# Patient Record
Sex: Female | Born: 1940 | Race: White | Hispanic: No | Marital: Single | Smoking: Never smoker
Health system: Southern US, Community
[De-identification: ages and names within clinical notes are randomized; demographics above are authoritative.]

## PROBLEM LIST (undated history)

## (undated) DIAGNOSIS — I4891 Unspecified atrial fibrillation: Secondary | ICD-10-CM

## (undated) DIAGNOSIS — I10 Essential (primary) hypertension: Secondary | ICD-10-CM

## (undated) DIAGNOSIS — G5603 Carpal tunnel syndrome, bilateral upper limbs: Secondary | ICD-10-CM

## (undated) DIAGNOSIS — Z7901 Long term (current) use of anticoagulants: Secondary | ICD-10-CM

## (undated) DIAGNOSIS — I495 Sick sinus syndrome: Secondary | ICD-10-CM

## (undated) DIAGNOSIS — J8283 Eosinophilic asthma: Secondary | ICD-10-CM

## (undated) DIAGNOSIS — I517 Cardiomegaly: Secondary | ICD-10-CM

## (undated) DIAGNOSIS — M1990 Unspecified osteoarthritis, unspecified site: Secondary | ICD-10-CM

## (undated) DIAGNOSIS — Z9841 Cataract extraction status, right eye: Secondary | ICD-10-CM

## (undated) DIAGNOSIS — J449 Chronic obstructive pulmonary disease, unspecified: Secondary | ICD-10-CM

## (undated) DIAGNOSIS — I071 Rheumatic tricuspid insufficiency: Secondary | ICD-10-CM

## (undated) DIAGNOSIS — I700 Atherosclerosis of aorta: Secondary | ICD-10-CM

## (undated) DIAGNOSIS — J45909 Unspecified asthma, uncomplicated: Secondary | ICD-10-CM

## (undated) DIAGNOSIS — K219 Gastro-esophageal reflux disease without esophagitis: Secondary | ICD-10-CM

## (undated) DIAGNOSIS — G5793 Unspecified mononeuropathy of bilateral lower limbs: Secondary | ICD-10-CM

## (undated) DIAGNOSIS — Z87442 Personal history of urinary calculi: Secondary | ICD-10-CM

## (undated) DIAGNOSIS — G629 Polyneuropathy, unspecified: Secondary | ICD-10-CM

## (undated) DIAGNOSIS — Z950 Presence of cardiac pacemaker: Secondary | ICD-10-CM

## (undated) HISTORY — PX: CARDIAC SURGERY: SHX584

## (undated) HISTORY — PX: HIP SURGERY: SHX245

## (undated) HISTORY — PX: TOTAL HIP ARTHROPLASTY: SHX124

## (undated) HISTORY — PX: CATARACT EXTRACTION, BILATERAL: SHX1313

## (undated) HISTORY — PX: BILATERAL CARPAL TUNNEL RELEASE: SHX6508

## (undated) HISTORY — DX: Presence of cardiac pacemaker: Z95.0

## (undated) HISTORY — PX: INSERT / REPLACE / REMOVE PACEMAKER: SUR710

## (undated) HISTORY — PX: TOTAL KNEE ARTHROPLASTY: SHX125

## (undated) HISTORY — PX: KNEE SURGERY: SHX244

## (undated) HISTORY — DX: Polyneuropathy, unspecified: G62.9

## (undated) HISTORY — PX: CARDIAC VALVE SURGERY: SHX40

## (undated) HISTORY — PX: PACEMAKER INSERTION: SHX728

## (undated) HISTORY — PX: CHOLECYSTECTOMY: SHX55

---

## 2002-07-27 HISTORY — PX: CARDIAC ELECTROPHYSIOLOGY STUDY AND ABLATION: SHX1294

## 2004-07-27 HISTORY — PX: CARDIAC ELECTROPHYSIOLOGY STUDY AND ABLATION: SHX1294

## 2007-07-28 DIAGNOSIS — Z950 Presence of cardiac pacemaker: Secondary | ICD-10-CM

## 2007-07-28 HISTORY — DX: Presence of cardiac pacemaker: Z95.0

## 2007-07-28 HISTORY — PX: PACEMAKER INSERTION: SHX728

## 2015-02-07 ENCOUNTER — Emergency Department
Admission: EM | Admit: 2015-02-07 | Discharge: 2015-02-07 | Disposition: A | Discharge status: Home / Self Care | Payer: Medicare Other | Attending: Emergency Medicine | Admitting: Emergency Medicine

## 2015-02-07 ENCOUNTER — Encounter: Payer: Self-pay | Admitting: Emergency Medicine

## 2015-02-07 ENCOUNTER — Emergency Department: Payer: Medicare Other

## 2015-02-07 DIAGNOSIS — R42 Dizziness and giddiness: Secondary | ICD-10-CM | POA: Insufficient documentation

## 2015-02-07 DIAGNOSIS — Z7901 Long term (current) use of anticoagulants: Secondary | ICD-10-CM | POA: Insufficient documentation

## 2015-02-07 DIAGNOSIS — Z79899 Other long term (current) drug therapy: Secondary | ICD-10-CM | POA: Diagnosis not present

## 2015-02-07 DIAGNOSIS — R55 Syncope and collapse: Secondary | ICD-10-CM | POA: Diagnosis not present

## 2015-02-07 HISTORY — DX: Unspecified osteoarthritis, unspecified site: M19.90

## 2015-02-07 HISTORY — DX: Unspecified atrial fibrillation: I48.91

## 2015-02-07 HISTORY — DX: Chronic obstructive pulmonary disease, unspecified: J44.9

## 2015-02-07 LAB — COMPREHENSIVE METABOLIC PANEL
ALBUMIN: 4.4 g/dL (ref 3.5–5.0)
ALT: 23 U/L (ref 14–54)
ANION GAP: 9 (ref 5–15)
AST: 29 U/L (ref 15–41)
Alkaline Phosphatase: 76 U/L (ref 38–126)
BUN: 16 mg/dL (ref 6–20)
CALCIUM: 9.6 mg/dL (ref 8.9–10.3)
CHLORIDE: 103 mmol/L (ref 101–111)
CO2: 28 mmol/L (ref 22–32)
Creatinine, Ser: 0.91 mg/dL (ref 0.44–1.00)
GFR calc Af Amer: >60 mL/min (ref >60)
GFR calc non Af Amer: >60 mL/min (ref >60)
Glucose, Bld: 101 mg/dL — ABNORMAL HIGH (ref 65–99)
Potassium: 3.7 mmol/L (ref 3.5–5.1)
Sodium: 140 mmol/L (ref 135–145)
TOTAL PROTEIN: 8.6 g/dL — AB (ref 6.5–8.1)
Total Bilirubin: 0.7 mg/dL (ref 0.3–1.2)

## 2015-02-07 LAB — URINALYSIS COMPLETE WITH MICROSCOPIC (ARMC ONLY)
Bacteria, UA: NONE SEEN
Bilirubin Urine: NEGATIVE
Glucose, UA: NEGATIVE mg/dL
Ketones, ur: NEGATIVE mg/dL
LEUKOCYTES UA: NEGATIVE
Nitrite: NEGATIVE
PROTEIN: NEGATIVE mg/dL
Specific Gravity, Urine: 1.004 — ABNORMAL LOW (ref 1.005–1.030)
pH: 7 (ref 5.0–8.0)

## 2015-02-07 LAB — CBC
HEMATOCRIT: 42.1 % (ref 35.0–47.0)
HEMOGLOBIN: 13.9 g/dL (ref 12.0–16.0)
MCH: 29.8 pg (ref 26.0–34.0)
MCHC: 33.1 g/dL (ref 32.0–36.0)
MCV: 90.1 fL (ref 80.0–100.0)
Platelets: 307 10*3/uL (ref 150–440)
RBC: 4.67 MIL/uL (ref 3.80–5.20)
RDW: 13.6 % (ref 11.5–14.5)
WBC: 7.2 10*3/uL (ref 3.6–11.0)

## 2015-02-07 LAB — TROPONIN I

## 2015-02-07 LAB — PROTIME-INR
INR: 2.12
PROTHROMBIN TIME: 23.9 s — AB (ref 11.4–15.0)

## 2015-02-07 MED ORDER — SODIUM CHLORIDE 0.9 % IV SOLN
1000.0000 mL | Freq: Once | INTRAVENOUS | Status: AC
  Administered 2015-02-07: 1000 mL via INTRAVENOUS

## 2015-02-07 NOTE — ED Notes (Signed)
Pt reports dizziness since last pm. Skin w/d

## 2015-02-07 NOTE — ED Notes (Signed)
Patient c/o an increased in dizziness and seeing "black dots" during IV insertion. Patient was placed in Trendelenburg position and symptoms have improved. Patient requested to be placed back in an semi-Fowlers position and symptoms have not returned at this time.

## 2015-02-07 NOTE — ED Provider Notes (Signed)
[facility] Emergency Department Provider Note  ____________________________________________  Time seen: On arrival  I have reviewed the triage vital signs and the nursing notes.   HISTORY  Chief Complaint Dizziness    HPI Kelly Olsen is a 74 y.o. female who complains of dizziness and near syncope today. She reports she has had dizziness symptoms intermittently for approximately one month but today's episode was particularly bad. She did not pass out. She has no chest pain. No nausea no vomiting. No shortness of breath. No headache, no neuro deficits. She reports a history of atrial fibrillation on Coumadin     Past Medical History  Diagnosis Date  . Arthritis   . Atrial fibrillation   . COPD (chronic obstructive pulmonary disease)     There are no active problems to display for this patient.   Past Surgical History  Procedure Laterality Date  . Pacemaker insertion    . Hip surgery    . Knee surgery    . Cholecystectomy      Current Outpatient Rx  Name  Route  Sig  Dispense  Refill  . amiodarone (PACERONE) 200 MG tablet   Oral   Take 1 tablet by mouth every morning.      1   . Calcium Carb-Cholecalciferol (CALCIUM 1000 + D) 1000-800 MG-UNIT TABS   Oral   Take 1 tablet by mouth daily.         . carvedilol (COREG) 25 MG tablet   Oral   Take 1 tablet by mouth 2 (two) times daily.      1   . furosemide (LASIX) 40 MG tablet   Oral   Take 1 tablet by mouth daily.      0   . losartan (COZAAR) 50 MG tablet   Oral   Take 1 tablet by mouth daily.      1   . Multiple Vitamin (MULTIVITAMIN) capsule   Oral   Take 1 capsule by mouth daily.         Marland Kitchen omeprazole (PRILOSEC) 20 MG capsule   Oral   Take 1 capsule by mouth daily.      0   . Potassium 99 MG TABS   Oral   Take 1 tablet by mouth daily.         Marland Kitchen warfarin (COUMADIN) 3 MG tablet   Oral   Take 1 tablet by mouth daily. Take as directed      2      Allergies Codeine  History reviewed. No pertinent family history.  Social History History  Substance Use Topics  . Smoking status: Never Smoker   . Smokeless tobacco: Not on file  . Alcohol Use: No    Review of Systems  Constitutional: Negative for fever. Eyes: Negative for visual changes. ENT: Negative for sore throat Cardiovascular: Negative for chest pain. Respiratory: Negative for shortness of breath. Gastrointestinal: Negative for abdominal pain, vomiting and diarrhea. Genitourinary: Negative for dysuria. Musculoskeletal: Negative for back pain. Skin: Negative for rash. Neurological: Positive for dizziness no vertigo Psychiatric: No anxiety  10-point ROS otherwise negative.  ____________________________________________   PHYSICAL EXAM:  VITAL SIGNS: ED Triage Vitals  Enc Vitals Group     BP 02/07/15 1138 150/92 mmHg     Pulse Rate 02/07/15 1138 101     Resp 02/07/15 1138 18     Temp 02/07/15 1138 98.3 F (36.8 C)     Temp Source 02/07/15 1138 Oral     SpO2 02/07/15  1138 97 %     Weight 02/07/15 1133 170 lb (77.111 kg)     Height 02/07/15 1133 5\' 8"  (1.727 m)     Head Cir --      Peak Flow --      Pain Score --      Pain Loc --      Pain Edu? --      Excl. in GC? --      Constitutional: Alert and oriented. Well appearing and in no distress. Eyes: Conjunctivae are normal.  ENT   Head: Normocephalic and atraumatic.   Mouth/Throat: Mucous membranes are moist. Cardiovascular: Normal rate, regular rhythm. Normal and symmetric distal pulses are present in all extremities. No murmurs, rubs, or gallops. Respiratory: Normal respiratory effort without tachypnea nor retractions. Breath sounds are clear and equal bilaterally.  Gastrointestinal: Soft and non-tender in all quadrants. No distention. There is no CVA tenderness. Genitourinary: deferred Musculoskeletal: Nontender with normal range of motion in all extremities. No lower extremity  tenderness nor edema. Neurologic:  Normal speech and language. No gross focal neurologic deficits are appreciated. Skin:  Skin is warm, dry and intact. No rash noted. Psychiatric: Mood and affect are normal. Patient exhibits appropriate insight and judgment.  ____________________________________________    LABS (pertinent positives/negatives)  Labs Reviewed  COMPREHENSIVE METABOLIC PANEL - Abnormal; Notable for the following:    Glucose, Bld 101 (*)    Total Protein 8.6 (*)    All other components within normal limits  URINALYSIS COMPLETEWITH MICROSCOPIC ([facility] ONLY) - Abnormal; Notable for the following:    Color, Urine YELLOW (*)    APPearance CLEAR (*)    Specific Gravity, Urine 1.004 (*)    Hgb urine dipstick 2+ (*)    Squamous Epithelial / LPF 0-5 (*)    All other components within normal limits  CBC  TROPONIN I  PROTIME-INR    ____________________________________________   EKG  ED ECG REPORT I, Jene EveryKINNER, Amro Winebarger, the attending physician, personally viewed and interpreted this ECG.   Date: 02/07/2015  EKG Time:  11:35 AM  Rate: 100  Rhythm: Ventricular paced rhythm  Axis: Normal  Intervals:Abnormal QRS  ST&T Change: Nonspecific   ____________________________________________    RADIOLOGY I have personally reviewed any xrays that were ordered on this patient: No acute distress  ____________________________________________   PROCEDURES  Procedure(s) performed: none  Critical Care performed: none  ____________________________________________   INITIAL IMPRESSION / ASSESSMENT AND PLAN / ED COURSE  Pertinent labs & imaging results that were available during my care of the patient were reviewed by me and considered in my medical decision making (see chart for details).  Patient well-appearing and in no distress upon initial exam. She has a history of atrial fibrillation with a pacemaker and has had dizziness for approximately one month intermittently.  She feels better in the emergency department. We will check labs and EKG chest x-ray and reevaluate. We will give 1 L normal saline IV and check orthostatics  ____________________________________________ ----------------------------------------- 3:25 PM on 02/07/2015 -----------------------------------------  Patient feels well. No more dizziness. Orthostatic normal. Discussed with cardiology Dr. Gwen PoundsKowalski will see her in the office tomorrow at 4 PM.  FINAL CLINICAL IMPRESSION(S) / ED DIAGNOSES  Final diagnoses:  Dizziness     Jene Everyobert Lalah Durango, MD 02/07/15 1525

## 2015-02-07 NOTE — Discharge Instructions (Signed)

## 2015-09-05 DIAGNOSIS — J449 Chronic obstructive pulmonary disease, unspecified: Secondary | ICD-10-CM

## 2015-09-05 DIAGNOSIS — I10 Essential (primary) hypertension: Secondary | ICD-10-CM | POA: Insufficient documentation

## 2015-09-05 DIAGNOSIS — I482 Chronic atrial fibrillation, unspecified: Secondary | ICD-10-CM

## 2016-07-09 DIAGNOSIS — R609 Edema, unspecified: Secondary | ICD-10-CM | POA: Insufficient documentation

## 2016-07-09 DIAGNOSIS — I509 Heart failure, unspecified: Secondary | ICD-10-CM | POA: Insufficient documentation

## 2016-07-27 HISTORY — PX: OTHER SURGICAL HISTORY: SHX169

## 2016-08-17 IMAGING — CR DG CHEST 1V PORT
1 series · 1 of 1 positions shown · non-contrast
Comparison: None.

CLINICAL DATA: Dizziness since last evening.

EXAM:
PORTABLE CHEST - 1 VIEW

[ap]
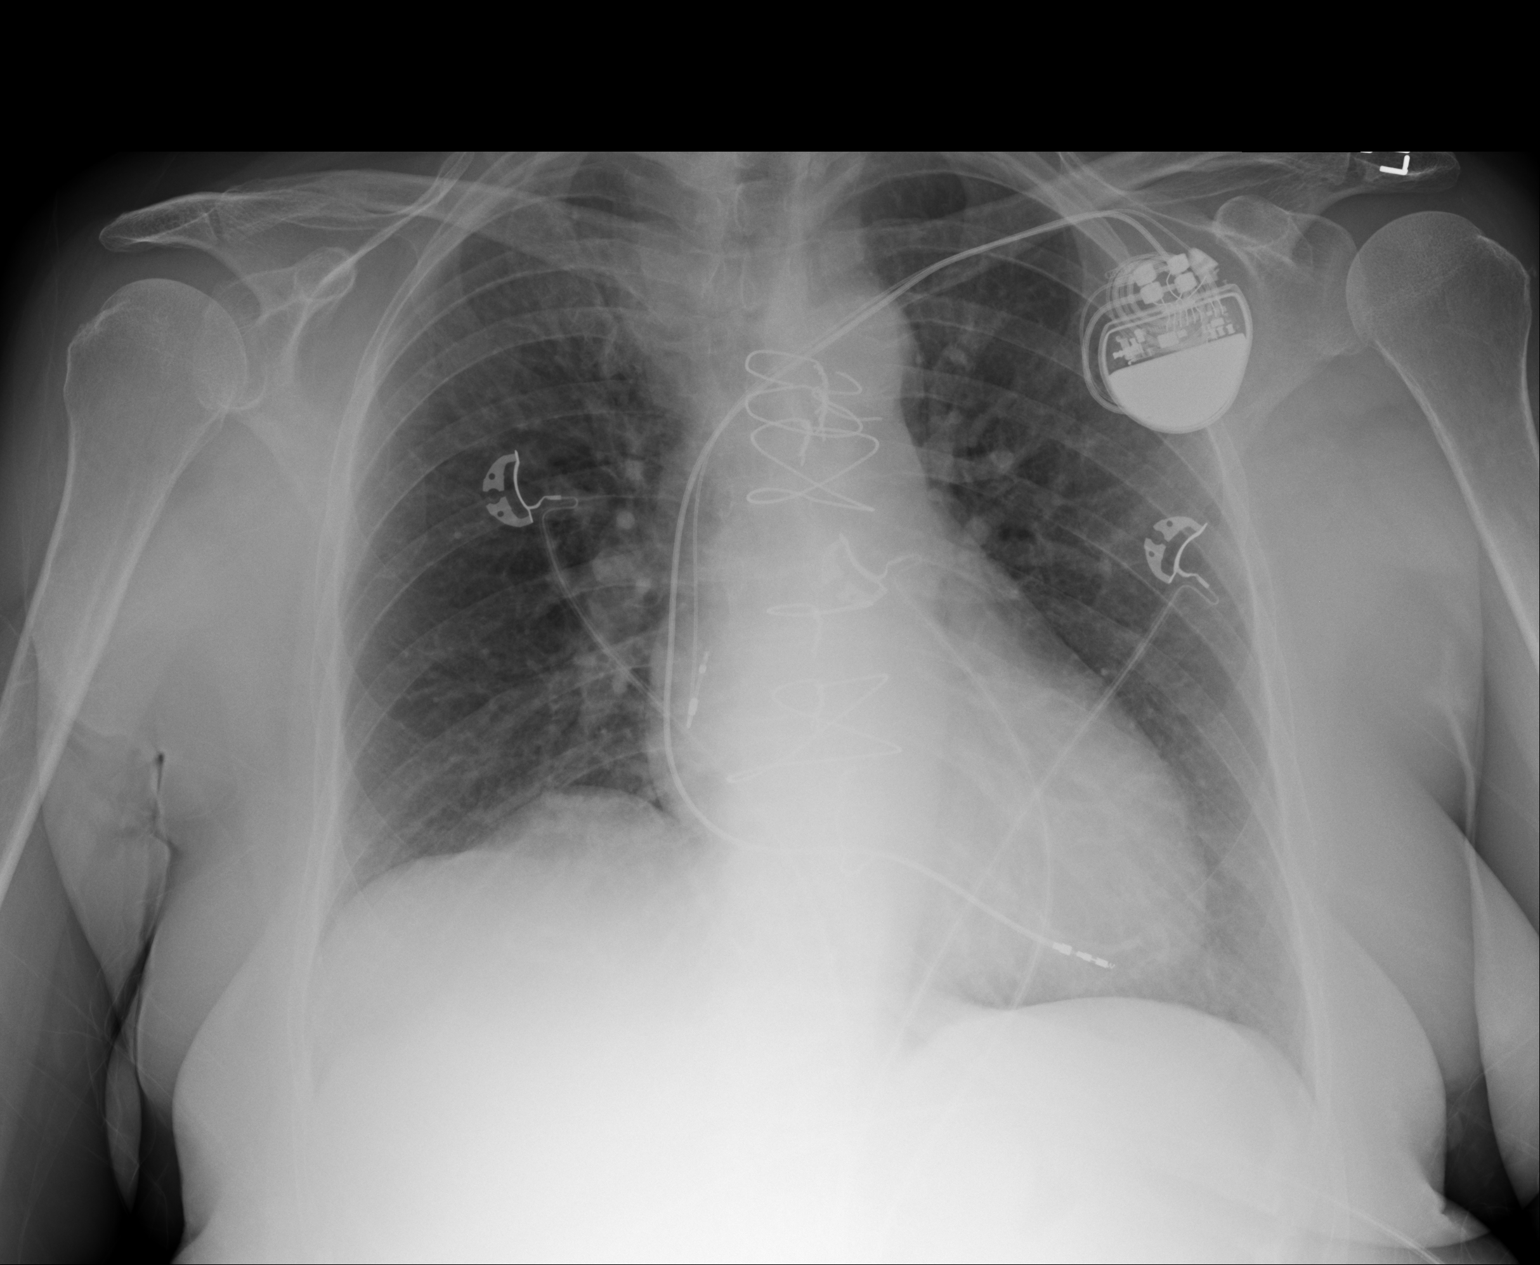

[1 of 1 positions shown; findings below may reference images not displayed]

FINDINGS: The cardiac silhouette, mediastinal and hilar contours are within
normal limits. There are surgical changes from bypass surgery. The
pacer wires are in good position. Slightly thickened paratracheal
soft tissues likely due to ectatic vasculature. The lungs are clear
of acute process. No pleural effusion or pneumothorax. The bony
thorax is intact.
IMPRESSION: No acute cardiopulmonary findings.

## 2019-02-09 DIAGNOSIS — Z0000 Encounter for general adult medical examination without abnormal findings: Secondary | ICD-10-CM | POA: Insufficient documentation

## 2019-03-31 DIAGNOSIS — Z9889 Other specified postprocedural states: Secondary | ICD-10-CM

## 2019-04-11 DIAGNOSIS — I872 Venous insufficiency (chronic) (peripheral): Secondary | ICD-10-CM | POA: Insufficient documentation

## 2019-04-21 DIAGNOSIS — I4819 Other persistent atrial fibrillation: Secondary | ICD-10-CM | POA: Insufficient documentation

## 2019-04-21 DIAGNOSIS — I071 Rheumatic tricuspid insufficiency: Secondary | ICD-10-CM | POA: Insufficient documentation

## 2021-01-22 ENCOUNTER — Other Ambulatory Visit
Admission: RE | Admit: 2021-01-22 | Discharge: 2021-01-22 | Disposition: A | Discharge status: Home / Self Care | Payer: Medicare Other | Source: Ambulatory Visit | Attending: Pulmonary Disease | Admitting: Pulmonary Disease

## 2021-01-22 DIAGNOSIS — R0602 Shortness of breath: Secondary | ICD-10-CM | POA: Insufficient documentation

## 2021-01-22 DIAGNOSIS — J8283 Eosinophilic asthma: Secondary | ICD-10-CM | POA: Insufficient documentation

## 2021-01-22 LAB — D-DIMER, QUANTITATIVE: D-Dimer, Quant: 0.37 ug/mL-FEU (ref 0.00–0.50)

## 2021-02-13 ENCOUNTER — Other Ambulatory Visit: Payer: Self-pay | Admitting: Family Medicine

## 2021-02-13 DIAGNOSIS — M5442 Lumbago with sciatica, left side: Secondary | ICD-10-CM

## 2021-02-13 DIAGNOSIS — G8929 Other chronic pain: Secondary | ICD-10-CM

## 2021-02-13 DIAGNOSIS — Z0000 Encounter for general adult medical examination without abnormal findings: Secondary | ICD-10-CM

## 2021-02-17 ENCOUNTER — Other Ambulatory Visit (HOSPITAL_COMMUNITY): Payer: Self-pay | Admitting: Family Medicine

## 2021-02-17 DIAGNOSIS — G8929 Other chronic pain: Secondary | ICD-10-CM

## 2021-02-17 DIAGNOSIS — Z0000 Encounter for general adult medical examination without abnormal findings: Secondary | ICD-10-CM

## 2021-02-19 ENCOUNTER — Other Ambulatory Visit (HOSPITAL_COMMUNITY): Payer: Self-pay | Admitting: Pulmonary Disease

## 2021-02-19 ENCOUNTER — Other Ambulatory Visit: Payer: Self-pay | Admitting: Pulmonary Disease

## 2021-02-19 DIAGNOSIS — G629 Polyneuropathy, unspecified: Secondary | ICD-10-CM

## 2021-02-19 DIAGNOSIS — J8283 Eosinophilic asthma: Secondary | ICD-10-CM

## 2021-02-27 ENCOUNTER — Encounter (HOSPITAL_COMMUNITY): Payer: Self-pay

## 2021-02-27 NOTE — Progress Notes (Unsigned)
I was asked to look up pt implant. Pt has pacemaker [id number] from [employer] which is not an MR Safe device.

## 2021-05-12 DIAGNOSIS — K2100 Gastro-esophageal reflux disease with esophagitis, without bleeding: Secondary | ICD-10-CM | POA: Insufficient documentation

## 2021-05-12 DIAGNOSIS — J45909 Unspecified asthma, uncomplicated: Secondary | ICD-10-CM | POA: Insufficient documentation

## 2021-07-03 ENCOUNTER — Ambulatory Visit
Admission: RE | Admit: 2021-07-03 | Discharge: 2021-07-03 | Disposition: A | Discharge status: Home / Self Care | Payer: Medicare Other | Source: Ambulatory Visit | Attending: Pulmonary Disease | Admitting: Pulmonary Disease

## 2021-07-03 ENCOUNTER — Other Ambulatory Visit: Payer: Self-pay

## 2021-07-03 DIAGNOSIS — G629 Polyneuropathy, unspecified: Secondary | ICD-10-CM | POA: Diagnosis present

## 2021-07-03 DIAGNOSIS — J8283 Eosinophilic asthma: Secondary | ICD-10-CM | POA: Insufficient documentation

## 2021-07-03 HISTORY — DX: Essential (primary) hypertension: I10

## 2021-07-03 HISTORY — DX: Unspecified asthma, uncomplicated: J45.909

## 2021-07-03 LAB — POCT I-STAT CREATININE: Creatinine, Ser: 0.9 mg/dL (ref 0.44–1.00)

## 2021-07-03 MED ORDER — IOHEXOL 300 MG/ML  SOLN
75.0000 mL | Freq: Once | INTRAMUSCULAR | Status: AC | PRN
  Administered 2021-07-03: 75 mL via INTRAVENOUS

## 2021-10-03 DIAGNOSIS — M1712 Unilateral primary osteoarthritis, left knee: Secondary | ICD-10-CM | POA: Insufficient documentation

## 2021-11-06 ENCOUNTER — Ambulatory Visit (INDEPENDENT_AMBULATORY_CARE_PROVIDER_SITE_OTHER): Payer: Medicare Other | Admitting: Urology

## 2021-11-06 ENCOUNTER — Ambulatory Visit
Admission: RE | Admit: 2021-11-06 | Discharge: 2021-11-06 | Disposition: A | Discharge status: Home / Self Care | Payer: Medicare Other | Source: Ambulatory Visit | Attending: Urology | Admitting: Urology

## 2021-11-06 ENCOUNTER — Encounter: Payer: Self-pay | Admitting: Urology

## 2021-11-06 ENCOUNTER — Ambulatory Visit
Admission: RE | Admit: 2021-11-06 | Discharge: 2021-11-06 | Disposition: A | Discharge status: Home / Self Care | Payer: Medicare Other | Attending: Urology | Admitting: Urology

## 2021-11-06 VITALS — BP 137/84 | HR 60 | Ht 66.0 in | Wt 187.0 lb

## 2021-11-06 DIAGNOSIS — N200 Calculus of kidney: Secondary | ICD-10-CM

## 2021-11-06 LAB — URINALYSIS, COMPLETE
Bilirubin, UA: NEGATIVE
Glucose, UA: NEGATIVE
Ketones, UA: NEGATIVE
Nitrite, UA: NEGATIVE
Protein,UA: NEGATIVE
Specific Gravity, UA: 1.015 (ref 1.005–1.030)
Urobilinogen, Ur: 0.2 mg/dL (ref 0.2–1.0)
pH, UA: 6.5 (ref 5.0–7.5)

## 2021-11-06 LAB — MICROSCOPIC EXAMINATION: Epithelial Cells (non renal): NONE SEEN /hpf (ref 0–10)

## 2021-11-06 NOTE — Patient Instructions (Signed)
Cranberry amd D-mannose ?

## 2021-11-06 NOTE — Progress Notes (Signed)
? ?11/06/2021 ?10:37 AM  ? ?London Pepper ?1941/06/26 ?[id number] ? ?Referring provider: Wilford Corner, PA-C ?[phone] ?[place],  [place] [address] ? ?Chief Complaint  ?Patient presents with  ? Nephrolithiasis  ? ? ?HPI: ?Kelly Olsen is a 81 y.o. female referred for nephrolithiasis. ? ?Chest CT December 2022 with incidental right upper pole renal calculus measuring 7 mm ?For the past week he has had mild right flank pain ?Prior history of stone disease treated with shockwave lithotripsy ?Recently diagnosed with Enterococcus UTI and had a UTI approximately 6 months ago ?Presently asymptomatic ? ? ?PMH: ?Past Medical History:  ?Diagnosis Date  ? Arthritis   ? Asthma   ? Atrial fibrillation (HCC)   ? COPD (chronic obstructive pulmonary disease) (HCC)   ? Hypertension   ? ? ?Surgical History: ?Past Surgical History:  ?Procedure Laterality Date  ? CHOLECYSTECTOMY    ? HIP SURGERY    ? KNEE SURGERY    ? PACEMAKER INSERTION    ? ? ?Home Medications:  ?Allergies as of 11/06/2021   ? ?   Reactions  ? Codeine Nausea Only  ? ?  ? ?  ?Medication List  ?  ? ?  ? Accurate as of November 06, 2021 10:37 AM. If you have any questions, ask your nurse or doctor.  ?  ?  ? ?  ? ?amiodarone 200 MG tablet ?Commonly known as: PACERONE ?Take 1 tablet by mouth every morning. ?  ?Calcium Carb-Cholecalciferol 1000-800 MG-UNIT Tabs ?Take 1 tablet by mouth daily. ?  ?carvedilol 25 MG tablet ?Commonly known as: COREG ?Take 1 tablet by mouth 2 (two) times daily. ?  ?furosemide 40 MG tablet ?Commonly known as: LASIX ?Take 1 tablet by mouth daily. ?  ?losartan 50 MG tablet ?Commonly known as: COZAAR ?Take 1 tablet by mouth daily. ?  ?multivitamin capsule ?Take 1 capsule by mouth daily. ?  ?omeprazole 20 MG capsule ?Commonly known as: PRILOSEC ?Take 1 capsule by mouth daily. ?  ?Potassium 99 MG Tabs ?Take 1 tablet by mouth daily. ?  ?warfarin 3 MG tablet ?Commonly known as: COUMADIN ?Take 1 tablet by mouth daily. Take as directed ?  ? ?   ? ? ?Allergies:  ?Allergies  ?Allergen Reactions  ? Codeine Nausea Only  ? ? ?Family History: ?No family history on file. ? ?Social History:  reports that she has never smoked. She does not have any smokeless tobacco history on file. She reports that she does not drink alcohol. No history on file for drug use. ? ? ?Physical Exam: ?BP 137/84   Pulse 60   Ht 5\' 6"  (1.676 m)   Wt 187 lb (84.8 kg)   BMI 30.18 kg/m?   ?Constitutional:  Alert and oriented, No acute distress. ?HEENT: [place] AT, moist mucus membranes.  Trachea midline, no masses. ?Cardiovascular: No clubbing, cyanosis, or edema. ?Respiratory: Normal respiratory effort, no increased work of breathing. ?Psychiatric: Normal mood and affect. ? ?Laboratory Data: ? ?Urinalysis ?Dipstick/microscopy negative ? ? ?Pertinent Imaging: ?Chest CT images were personally reviewed and interpreted.  Right upper pole calculus at the periphery ? ?Assessment & Plan:   ? ?1. Nephrolithiasis ?Nonobstructing right renal calculus ?KUB ordered and will notify with results ?May need additional imaging including renal ultrasound or CT ?She is uncomfortable having a stone present and is leaning towards treatment ? ? ? , MD ? ?[facility] ?[address], [address] ?[place], [place] ?[phone] ? ? ? ? ? ? ?

## 2021-11-09 ENCOUNTER — Other Ambulatory Visit: Payer: Self-pay | Admitting: Urology

## 2021-11-09 ENCOUNTER — Telehealth: Payer: Self-pay | Admitting: Urology

## 2021-11-09 DIAGNOSIS — N200 Calculus of kidney: Secondary | ICD-10-CM

## 2021-11-09 NOTE — Telephone Encounter (Signed)
KUB showed calcifications overlying both renal outlines.  She was interested in stone treatment at our office visit last week and would recommend a stone protocol CT for further evaluation.  Order was entered and will contact with results. ?

## 2021-11-10 NOTE — Telephone Encounter (Signed)
Notified patient as instructed, patient pleased °

## 2022-03-24 ENCOUNTER — Telehealth: Payer: Self-pay | Admitting: Family Medicine

## 2022-03-24 DIAGNOSIS — N200 Calculus of kidney: Secondary | ICD-10-CM

## 2022-03-24 NOTE — Telephone Encounter (Signed)
Patient called and states she was suppose to have a call to schedule a CT scan appointment. I see in your last phone call you recommend her do a CT scan. The order has not been placed. Do you still want her to have this done?

## 2022-03-24 NOTE — Addendum Note (Signed)
Addended by: Honor Loh on: 03/24/2022 04:16 PM   Modules accepted: Orders

## 2022-03-24 NOTE — Telephone Encounter (Signed)
CT order has been placed.  Can cancel appointment tomorrow and will call with CT results or if she would like an office visit can reschedule

## 2022-03-24 NOTE — Addendum Note (Signed)
Addended by: Riki Altes on: 03/24/2022 12:33 PM   Modules accepted: Orders

## 2022-03-25 ENCOUNTER — Ambulatory Visit
Admission: RE | Admit: 2022-03-25 | Discharge: 2022-03-25 | Disposition: A | Discharge status: Home / Self Care | Payer: Medicare Other | Source: Ambulatory Visit | Attending: Urology | Admitting: Urology

## 2022-03-25 ENCOUNTER — Ambulatory Visit
Admission: RE | Admit: 2022-03-25 | Discharge: 2022-03-25 | Disposition: A | Discharge status: Home / Self Care | Payer: Medicare Other | Attending: Urology | Admitting: Urology

## 2022-03-25 ENCOUNTER — Encounter: Payer: Self-pay | Admitting: Urology

## 2022-03-25 ENCOUNTER — Ambulatory Visit (INDEPENDENT_AMBULATORY_CARE_PROVIDER_SITE_OTHER): Payer: Medicare Other | Admitting: Urology

## 2022-03-25 VITALS — BP 112/70 | HR 60 | Ht 66.0 in | Wt 188.0 lb

## 2022-03-25 DIAGNOSIS — N3000 Acute cystitis without hematuria: Secondary | ICD-10-CM

## 2022-03-25 DIAGNOSIS — R300 Dysuria: Secondary | ICD-10-CM

## 2022-03-25 DIAGNOSIS — N200 Calculus of kidney: Secondary | ICD-10-CM

## 2022-03-25 LAB — MICROSCOPIC EXAMINATION: WBC, UA: >30 /hpf — AB (ref 0–5)

## 2022-03-25 LAB — URINALYSIS, COMPLETE
Bilirubin, UA: NEGATIVE
Glucose, UA: NEGATIVE
Ketones, UA: NEGATIVE
Nitrite, UA: NEGATIVE
Protein,UA: NEGATIVE
Specific Gravity, UA: 1.01 (ref 1.005–1.030)
Urobilinogen, Ur: 0.2 mg/dL (ref 0.2–1.0)
pH, UA: 6.5 (ref 5.0–7.5)

## 2022-03-25 MED ORDER — SULFAMETHOXAZOLE-TRIMETHOPRIM 800-160 MG PO TABS: 1.0000 | ORAL_TABLET | Freq: Two times a day (BID) | ORAL | 0 refills | Status: DC

## 2022-03-25 NOTE — Patient Instructions (Signed)
Take over the counter D-mannose for UTI prevention.

## 2022-03-26 ENCOUNTER — Encounter: Payer: Self-pay | Admitting: Urology

## 2022-03-26 NOTE — Progress Notes (Signed)
03/25/2022 7:38 AM   Kelly Olsen Jan 18, 1941 [id number]  Referring provider: Wilford Corner, PA-C [address] [place],  [place] [address]  Chief Complaint  Patient presents with   Dysuria    HPI: 81 y.o. female called for follow-up visit for dysuria and voiding symptoms  Initially seen 11/06/2021 for nephrolithiasis. Was treated for a UTI early August.  UA showed significant pyuria though culture grew mixed flora Several week history of frequency, dysuria and low back pain No fever, chills, nausea or vomiting   PMH: Past Medical History:  Diagnosis Date   Arthritis    Asthma    Atrial fibrillation (HCC)    COPD (chronic obstructive pulmonary disease) (HCC)    Hypertension     Surgical History: Past Surgical History:  Procedure Laterality Date   CHOLECYSTECTOMY     HIP SURGERY     KNEE SURGERY     PACEMAKER INSERTION      Home Medications:  Allergies as of 03/25/2022       Reactions   Tramadol    Other reaction(s): Other (See Comments) Causes extreme sleepiness, "wooziness"   Codeine Nausea Only   Amlodipine Rash        Medication List        Accurate as of March 25, 2022 11:59 PM. If you have any questions, ask your nurse or doctor.          STOP taking these medications    amiodarone 200 MG tablet Commonly known as: PACERONE Stopped by: Riki Altes, MD   Combivent Respimat 20-100 MCG/ACT Aers respimat Generic drug: Ipratropium-Albuterol Stopped by: Riki Altes, MD   furosemide 40 MG tablet Commonly known as: LASIX Stopped by: Riki Altes, MD   vitamin C 1000 MG tablet Stopped by: Riki Altes, MD       TAKE these medications    Calcium Carb-Cholecalciferol 1000-800 MG-UNIT Tabs Take 1 tablet by mouth daily.   carvedilol 25 MG tablet Commonly known as: COREG Take 1 tablet by mouth 2 (two) times daily.   gabapentin 600 MG tablet Commonly known as: NEURONTIN Take 600 mg by mouth at bedtime.    losartan 50 MG tablet Commonly known as: COZAAR Take 1 tablet by mouth daily.   montelukast 10 MG tablet Commonly known as: SINGULAIR Take 10 mg by mouth daily.   multivitamin capsule Take 1 capsule by mouth daily.   omeprazole 20 MG capsule Commonly known as: PRILOSEC Take 1 capsule by mouth daily.   Potassium 99 MG Tabs Take 1 tablet by mouth daily.   sulfamethoxazole-trimethoprim 800-160 MG tablet Commonly known as: BACTRIM DS Take 1 tablet by mouth every 12 (twelve) hours. Started by: Riki Altes, MD   torsemide 20 MG tablet Commonly known as: DEMADEX Take 20 mg by mouth 2 (two) times daily.   warfarin 3 MG tablet Commonly known as: COUMADIN Take 1 tablet by mouth daily. Take as directed        Allergies:  Allergies  Allergen Reactions   Tramadol     Other reaction(s): Other (See Comments) Causes extreme sleepiness, "wooziness"   Codeine Nausea Only   Amlodipine Rash    Family History: History reviewed. No pertinent family history.  Social History:  reports that she has never smoked. She does not have any smokeless tobacco history on file. She reports that she does not drink alcohol. No history on file for drug use.   Physical Exam: BP 112/70   Pulse 60  Ht 5\' 6"  (1.676 m)   Wt 188 lb (85.3 kg)   BMI 30.34 kg/m   Constitutional:  Alert and oriented, No acute distress. HEENT: [place] AT, moist mucus membranes.  Trachea midline, no masses. Cardiovascular: No clubbing, cyanosis, or edema. Respiratory: Normal respiratory effort, no increased work of breathing. Psychiatric: Normal mood and affect.  Laboratory Data:  Urinalysis Dipstick 1+ blood/2+ leukocytes Microscopy >30 WBC/3-10 RBC/moderate bacteria   Pertinent Imaging: KUB was obtained today and stable renal calculi  Assessment & Plan:    1.  Acute cystitis Urinalysis with significant pyuria Urine culture repeated Rx Septra DS sent to pharmacy pending culture If urine culture  negative will schedule cystoscopy  2.  Nephrolithiasis Stone protocol CT ordered   , MD  [facility] [address], [address] [place], [place] [phone]

## 2022-03-31 ENCOUNTER — Other Ambulatory Visit: Payer: Self-pay | Admitting: *Deleted

## 2022-03-31 LAB — CULTURE, URINE COMPREHENSIVE

## 2022-03-31 MED ORDER — AMOXICILLIN 875 MG PO TABS: 875.0000 mg | ORAL_TABLET | Freq: Two times a day (BID) | ORAL | 0 refills | Status: AC

## 2022-03-31 NOTE — Telephone Encounter (Signed)
Urine culture was positive and resistant to Septra DS.  Please have her discontinue the Septra and send in Rx amoxicillin 875 mg twice daily x7 days.  Notified patient as instructed, patient pleased.

## 2022-04-10 ENCOUNTER — Ambulatory Visit
Admission: RE | Admit: 2022-04-10 | Discharge: 2022-04-10 | Disposition: A | Discharge status: Home / Self Care | Payer: Medicare Other | Source: Ambulatory Visit | Attending: Urology | Admitting: Urology

## 2022-04-10 DIAGNOSIS — N200 Calculus of kidney: Secondary | ICD-10-CM | POA: Diagnosis present

## 2022-04-14 ENCOUNTER — Telehealth: Payer: Self-pay | Admitting: *Deleted

## 2022-04-14 NOTE — Telephone Encounter (Signed)
Notified patient as instructed,Discussed follow-up appointments, 

## 2022-04-14 NOTE — Telephone Encounter (Signed)
-----   Message from Abbie Sons, MD sent at 04/14/2022  7:42 AM EDT ----- CT showed bilateral, nonobstructing renal calculi.  If symptoms have resolved after antibiotic treatment schedule I year follow-up with KUB

## 2022-04-26 DIAGNOSIS — Z8616 Personal history of COVID-19: Secondary | ICD-10-CM

## 2022-04-26 DIAGNOSIS — U071 COVID-19: Secondary | ICD-10-CM

## 2022-04-26 HISTORY — DX: COVID-19: U07.1

## 2022-04-26 HISTORY — DX: Personal history of COVID-19: Z86.16

## 2022-05-17 ENCOUNTER — Other Ambulatory Visit
Admission: RE | Admit: 2022-05-17 | Discharge: 2022-05-17 | Disposition: A | Discharge status: Home / Self Care | Payer: Medicare Other | Source: Ambulatory Visit | Attending: Emergency Medicine | Admitting: Emergency Medicine

## 2022-05-17 DIAGNOSIS — R42 Dizziness and giddiness: Secondary | ICD-10-CM | POA: Diagnosis present

## 2022-05-17 LAB — BASIC METABOLIC PANEL
Anion gap: 8 (ref 5–15)
BUN: 14 mg/dL (ref 8–23)
CO2: 28 mmol/L (ref 22–32)
Calcium: 8.9 mg/dL (ref 8.9–10.3)
Chloride: 101 mmol/L (ref 98–111)
Creatinine, Ser: 1.16 mg/dL — ABNORMAL HIGH (ref 0.44–1.00)
GFR, Estimated: 48 mL/min — ABNORMAL LOW (ref >60)
Glucose, Bld: 88 mg/dL (ref 70–99)
Potassium: 3.7 mmol/L (ref 3.5–5.1)
Sodium: 137 mmol/L (ref 135–145)

## 2022-09-07 DIAGNOSIS — G5602 Carpal tunnel syndrome, left upper limb: Secondary | ICD-10-CM | POA: Insufficient documentation

## 2023-01-20 DIAGNOSIS — G5601 Carpal tunnel syndrome, right upper limb: Secondary | ICD-10-CM | POA: Insufficient documentation

## 2023-02-01 ENCOUNTER — Other Ambulatory Visit: Payer: Self-pay | Admitting: Surgery

## 2023-02-02 ENCOUNTER — Other Ambulatory Visit: Payer: Self-pay

## 2023-02-02 ENCOUNTER — Encounter
Admission: RE | Admit: 2023-02-02 | Discharge: 2023-02-02 | Disposition: A | Discharge status: Home / Self Care | Payer: Medicare Other | Source: Ambulatory Visit | Attending: Surgery | Admitting: Surgery

## 2023-02-02 VITALS — Ht 68.0 in | Wt 182.0 lb

## 2023-02-02 DIAGNOSIS — I10 Essential (primary) hypertension: Secondary | ICD-10-CM

## 2023-02-02 DIAGNOSIS — Z7901 Long term (current) use of anticoagulants: Secondary | ICD-10-CM

## 2023-02-02 HISTORY — DX: Personal history of urinary calculi: Z87.442

## 2023-02-02 HISTORY — DX: Gastro-esophageal reflux disease without esophagitis: K21.9

## 2023-02-02 NOTE — Patient Instructions (Addendum)
Your procedure is scheduled on: Wednesday 02/10/23 To find out your arrival time, please call [phone] between 1PM - 3PM on:   Tuesday 02/09/23 Report to the Registration Desk on the 1st floor of the [facility]. Free Valet parking is available.  If your arrival time is 6:00 am, do not arrive before that time as the [facility] entrance doors do not open until 6:00 am.  REMEMBER: Instructions that are not followed completely may result in serious medical risk, up to and including death; or upon the discretion of your surgeon and anesthesiologist your surgery may need to be rescheduled.  Do not eat food after midnight the night before surgery.  No gum chewing or hard candies.  You may however, drink CLEAR liquids up to 2 hours before you are scheduled to arrive for your surgery. Do not drink anything within 2 hours of your scheduled arrival time.  Clear liquids include: - water  - apple juice without pulp - gatorade (not RED colors) - black coffee or tea (Do NOT add milk or creamers to the coffee or tea) Do NOT drink anything that is not on this list.  Type 1 and Type 2 diabetics should only drink water.  In addition, your doctor has ordered for you to drink the provided:  Ensure Pre-Surgery Clear Carbohydrate Drink  Drinking this carbohydrate drink up to two hours before surgery helps to reduce insulin resistance and improve patient outcomes. Please complete drinking 2 hours before scheduled arrival time.  One week prior to surgery: Stop Anti-inflammatories (NSAIDS) such as Advil, Aleve, Ibuprofen, Motrin, Naproxen, Naprosyn and Aspirin based products such as Excedrin, Goody's Powder, BC Powder. You may however, continue to take Tylenol if needed for pain up until the day of surgery.  Stop ANY OVER THE COUNTER supplements or vitamins until after surgery.  Continue taking all prescribed medications with the exception of the following: Coumadin, hold as instructed for 3  days.  TAKE ONLY THESE MEDICATIONS THE MORNING OF SURGERY WITH A SIP OF WATER:  carvedilol (COREG) 25 MG tablet  omeprazole (PRILOSEC) 20 MG capsule Antacid (take one the night before and one on the morning of surgery - helps to prevent nausea after surgery.)  Use you nebulizer and inhalers on the day of surgery and bring inhaler to the hospital.  No Alcohol for 24 hours before or after surgery.  No Smoking including e-cigarettes for 24 hours before surgery.  No chewable tobacco products for at least 6 hours before surgery.  No nicotine patches on the day of surgery.  Do not use any "recreational" drugs for at least a week (preferably 2 weeks) before your surgery.  Please be advised that the combination of cocaine and anesthesia may have negative outcomes, up to and including death. If you test positive for cocaine, your surgery will be cancelled.  On the morning of surgery brush your teeth with toothpaste and water, you may rinse your mouth with mouthwash if you wish. Do not swallow any toothpaste or mouthwash.  Use CHG Soap or wipes as directed on instruction sheet.  Do not wear lotions, powders, or perfumes.   Do not shave body hair from the neck down 48 hours before surgery.  Wear comfortable clothing (specific to your surgery type) to the hospital.  Do not wear jewelry, make-up, hairpins, clips or nail polish.  Contact lenses, hearing aids and dentures may not be worn into surgery.  Do not bring valuables to the hospital. [facility] is not responsible  for any missing/lost belongings or valuables.   Notify your doctor if there is any change in your medical condition (cold, fever, infection).  If you are being discharged the day of surgery, you will not be allowed to drive home. You will need a responsible individual to drive you home and stay with you for 24 hours after surgery.   If you are taking public transportation, you will need to have a responsible individual  with you.  If you are being admitted to the hospital overnight, leave your suitcase in the car. After surgery it may be brought to your room.  In case of increased patient census, it may be necessary for you, the patient, to continue your postoperative care in the Same Day Surgery department.  After surgery, you can help prevent lung complications by doing breathing exercises.  Take deep breaths and cough every 1-2 hours. Your doctor may order a device called an Incentive Spirometer to help you take deep breaths. When coughing or sneezing, hold a pillow firmly against your incision with both hands. This is called "splinting." Doing this helps protect your incision. It also decreases belly discomfort.  Surgery Visitation Policy:  Patients undergoing a surgery or procedure may have two family members or support persons with them as long as the person is not COVID-19 positive or experiencing its symptoms.   Inpatient Visitation:    Visiting hours are 7 a.m. to 8 p.m. Up to four visitors are allowed at one time in a patient room. The visitors may rotate out with other people during the day. One designated support person (adult) may remain overnight.  Please call the Pre-admissions Testing Dept. at [phone] if you have any questions about these instructions.     Preparing for Surgery with CHLORHEXIDINE GLUCONATE (CHG) Soap  Chlorhexidine Gluconate (CHG) Soap  o An antiseptic cleaner that kills germs and bonds with the skin to continue killing germs even after washing  o Used for showering the night before surgery and morning of surgery  Before surgery, you can play an important role by reducing the number of germs on your skin.  CHG (Chlorhexidine gluconate) soap is an antiseptic cleanser which kills germs and bonds with the skin to continue killing germs even after washing.  Please do not use if you have an allergy to CHG or antibacterial soaps. If your skin becomes  reddened/irritated stop using the CHG.  1. Shower the NIGHT BEFORE SURGERY and the MORNING OF SURGERY with CHG soap.  2. If you choose to wash your hair, wash your hair first as usual with your normal shampoo.  3. After shampooing, rinse your hair and body thoroughly to remove the shampoo.  4. Use CHG as you would any other liquid soap. You can apply CHG directly to the skin and wash gently with a scrungie or a clean washcloth.  5. Apply the CHG soap to your body only from the neck down. Do not use on open wounds or open sores. Avoid contact with your eyes, ears, mouth, and genitals (private parts). Wash face and genitals (private parts) with your normal soap.  6. Wash thoroughly, paying special attention to the area where your surgery will be performed.  7. Thoroughly rinse your body with warm water.  8. Do not shower/wash with your normal soap after using and rinsing off the CHG soap.  9. Pat yourself dry with a clean towel.  10. Wear clean pajamas to bed the night before surgery.  12. Place clean  sheets on your bed the night of your first shower and do not sleep with pets.  13. Shower again with the CHG soap on the day of surgery prior to arriving at the hospital.  14. Do not apply any deodorants/lotions/powders.  15. Please wear clean clothes to the hospital.

## 2023-02-04 ENCOUNTER — Encounter: Payer: Self-pay | Admitting: Urgent Care

## 2023-02-04 ENCOUNTER — Encounter: Payer: Self-pay | Admitting: Surgery

## 2023-02-04 ENCOUNTER — Encounter
Admission: RE | Admit: 2023-02-04 | Discharge: 2023-02-04 | Disposition: A | Discharge status: Home / Self Care | Payer: Medicare Other | Source: Ambulatory Visit | Attending: Surgery | Admitting: Surgery

## 2023-02-04 DIAGNOSIS — Z950 Presence of cardiac pacemaker: Secondary | ICD-10-CM | POA: Diagnosis not present

## 2023-02-04 DIAGNOSIS — Z01818 Encounter for other preprocedural examination: Secondary | ICD-10-CM | POA: Insufficient documentation

## 2023-02-04 DIAGNOSIS — R9431 Abnormal electrocardiogram [ECG] [EKG]: Secondary | ICD-10-CM | POA: Diagnosis not present

## 2023-02-04 DIAGNOSIS — I10 Essential (primary) hypertension: Secondary | ICD-10-CM | POA: Insufficient documentation

## 2023-02-04 DIAGNOSIS — Z01810 Encounter for preprocedural cardiovascular examination: Secondary | ICD-10-CM | POA: Diagnosis not present

## 2023-02-04 NOTE — Progress Notes (Signed)
Perioperative / Anesthesia Services  Pre-Admission Testing Clinical Review / Preoperative Anesthesia Consult  Date: 02/09/23  Patient Demographics:  Name: Kelly Olsen DOB:   [birth year] MRN:   [id number]  Planned Surgical Procedure(s):    Case: [id number] Date/Time: 02/10/23 0715   Procedure: REVISION OPEN CARPAL TUNNEL RELEASE (Right: Wrist) - 2nd case   Anesthesia type: Choice   Pre-op diagnosis: Carpal tunnel syndrome, right G56.01   Location: [facility] OR ROOM 02 / [facility]   Surgeons: Christena Flake, MD     NOTE: Available PAT nursing documentation and vital signs have been reviewed. Clinical nursing staff has updated patient's PMH/PSHx, current medication list, and drug allergies/intolerances to ensure comprehensive history available to assist in medical decision making as it pertains to the aforementioned surgical procedure and anticipated anesthetic course. Extensive review of available clinical information personally performed. [place] [facility] and PSH[facility] updated with any diagnoses/procedures that  may have been inadvertently omitted during her intake with the pre-admission testing department's nursing staff.  Clinical Discussion:  Kelly Olsen is a 82 y.o. female who is submitted for pre-surgical anesthesia review and clearance prior to her undergoing the above procedure. Patient has never been a smoker. 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  Patient is followed by cardiology Darrold Junker, MD). She was last seen in the cardiology clinic on 03/04/2022; notes reviewed. At the time of her clinic visit, patient doing well overall from a  cardiovascular perspective. Patient denied any chest pain, shortness of breath, PND, orthopnea, palpitations, significant peripheral edema, weakness, fatigue, vertiginous symptoms, or presyncope/syncope. Patient with a past medical history significant for cardiovascular diagnoses. Documented physical exam was grossly benign, providing no evidence of acute exacerbation and/or decompensation of the patient's known cardiovascular conditions.  Of note, complete records regarding cardiovascular history unavailable for review at time of consult.  In addition to the care that she has received here [place], patient has received care and states of [place] and [place]. Information gathered from patient report and from notes provided by her local cardiologist.  Unavailable  Patient with a history of severe tricuspid valve insufficiency.  She underwent tricuspid valve repair via a median sternotomy in approximately 2005/2006 while living in [place].  Patient with a history of sick sinus syndrome.  She underwent placement of a dual-chamber [employer] EL [id number] device in 2009 while living in [place].  Battery was changed out in 2018 while patient was living in [place]. Cardiac device is regularly interrogated by patient's primary cardiology/electrophysiology team.  Last interrogation was on 04/21/2022, at which time device was noted to be functioning properly.  Most recent myocardial perfusion imaging study was performed on 06/04/2021 revealing a normal left ventricular systolic function with a hyperdynamic LVEF of 75%.  There were no regional wall motion abnormalities.  There was no evidence of stress-induced myocardial ischemia or arrhythmia; no scintigraphic evidence of scar.  Study determined to be low risk.  Most recent TTE was performed on 06/04/2021 revealing a normal left ventricular systolic function with an EF of >55%.  There were no regional wall motion abnormalities.  Right  ventricular size and function normal.  There was mild mitral, tricuspid, and pulmonary valve regurgitation.  All transvalvular gradients were noted to be normal providing no evidence suggestive  of valvular stenosis.  There was no pericardial effusion.  Aorta normal in size with no evidence of aneurysmal dilatation.  Patient with an atrial fibrillation diagnosis; CHA2DS2-VASc Score = 5 (age x 2, sex, HTN, vascular disease history).patient has undergone 2 ablations in the past (2004 and 2006).  Cardiac rate and rhythm are currently being maintained using carvedilol.  Patient is chronically anticoagulated using warfarin.  She is reported to be compliant with anticoagulation therapy with no evidence or reports of GI bleeding.  Blood pressure well-controlled at 124/70 mmHg on currently prescribed beta-blocker (carvedilol), ARB (losartan), and diuretic (torsemide) therapies.  Patient is not on any type of lipid-lowering therapies for ASCVD prevention.  She is not diabetic.  Patient does not have an OSAH diagnosis. Functional capacity is somewhat limited by patient's age and multiple medical comorbidities.  With that said, patient is able to complete all of her ADL/IADLs independently without cardiovascular limitation.  Per the [facility], patient is able to achieve at least 4 METS of physical activity without experiencing any significant degree of angina/anginal equivalent symptoms No changes were made to her medication regimen during her visit with cardiology.  Patient scheduled to follow-up with outpatient cardiology in 12 months or sooner if needed.  Kelly Olsen is scheduled for an elective REVISION OPEN CARPAL TUNNEL RELEASE (Right: Wrist) on 02/10/2023 with Dr. Leron Croak, MD.  Given patient's past medical history significant for cardiovascular diagnoses, presurgical cardiac clearance was sought by the PAT team. Per cardiology, "this patient is optimized for surgery and may proceed with the planned procedural course  with a LOW risk of significant perioperative cardiovascular complications".  Again, this patient is on daily oral anticoagulation therapy.  She has been instructed on recommendations for holding her warfarin for 5 days prior to her procedure with plans to restart as soon as postoperative bleeding risk felt to be minimized by her attending surgeon. The patient has been instructed that her last dose of her warfarin should be on 02/02/2023. Per cardiology, patient will not require enoxaparin bridging for this procedure.  Patient denies previous perioperative complications with anesthesia in the past. In review of the EMR, there are no records available for review regarding patient's past surgical/anesthetic courses within the [facility] system.     02/02/2023   12:06 PM 03/25/2022   10:47 AM 11/06/2021   10:21 AM  Vitals with BMI  Height 5\' 8"  5\' 6"  5\' 6"   Weight 182 lbs 188 lbs 187 lbs  BMI 27.68 30.36 30.2  Systolic  112 137  Diastolic  70 84  Pulse  60 60    Providers/Specialists:   NOTE: Primary physician provider listed below. Patient may have been seen by APP or partner within same practice.   PROVIDER ROLE / SPECIALTY LAST OV  Poggi, Excell Seltzer, MD Orthopedics (Surgeon) 01/20/2023  Wilford Corner, PA-C Primary Care Provider 12/09/2022  Marcina Millard, MD Cardiology 03/04/2022  Vida Rigger, MD  Pulmonary Medicine 01/06/2023  Gerrie Nordmann, MD Rheumatology 11/16/2022   Allergies:  Tramadol, Codeine, and Amlodipine  Current Home Medications:   No current facility-administered medications for this encounter.    albuterol (PROVENTIL) (2.5 MG/3ML) 0.083% nebulizer solution   budesonide (PULMICORT) 0.5 MG/2ML nebulizer solution   Calcium Carb-Cholecalciferol 1000-800 MG-UNIT TABS   carvedilol (COREG) 25 MG tablet   DULoxetine (CYMBALTA) 60 MG capsule   Dupilumab (DUPIXENT) 300 MG/2ML SOPN   EPINEPHrine 0.3 mg/0.3 mL IJ SOAJ injection   gabapentin (NEURONTIN) 600 MG  tablet   Ipratropium-Albuterol (COMBIVENT)  20-100 MCG/ACT AERS respimat   losartan (COZAAR) 50 MG tablet   montelukast (SINGULAIR) 10 MG tablet   Multiple Vitamin (MULTIVITAMIN) capsule   omeprazole (PRILOSEC) 20 MG capsule   Potassium 99 MG TABS   torsemide (DEMADEX) 20 MG tablet   warfarin (COUMADIN) 3 MG tablet   History:   Past Medical History:  Diagnosis Date   Aortic atherosclerosis (HCC)    Arthritis    Atrial fibrillation (HCC)    a.) CHA2DS2VASc = 5 (age x2, sex, HTN, vascular disease history);  b.) s/p cardiac ablations x 2 (2004 and 2006) - records unavailable; c.) rate/rhythm maintained using carvedilol; chronically anticoagulated with warfarin   Bilateral carpal tunnel syndrome    a.) s/p BILATERAL release   COPD (chronic obstructive pulmonary disease) (HCC)    Eosinophilic asthma    a.) on biologic/mAB therapy (dupilumab)   GERD (gastroesophageal reflux disease)    History of 2019 novel coronavirus disease (COVID-19) 04/2022   History of bilateral cataract extraction    History of kidney stones    Hypertension    Long term current use of anticoagulant    a.) warfarin   Mild cardiomegaly    Neuropathy of both feet    Presence of permanent cardiac pacemaker 2009   a.) s/p placement of [employer] EL [id number] in 2009 [place]); b.) battery changed in 2018 ([place])   SA node dysfunction [facility])    a.) s/p dual chamber PPM placement 2009   Severe tricuspid valve insufficiency    a.) s/p TV repair via median sternotomy in ~2005/2006 done in [place]   Past Surgical History:  Procedure Laterality Date   BILATERAL CARPAL TUNNEL RELEASE Bilateral    CARDIAC ELECTROPHYSIOLOGY STUDY AND ABLATION N/A 2004   CARDIAC ELECTROPHYSIOLOGY STUDY AND ABLATION N/A 2006   CARDIAC VALVE SURGERY N/A    Procedure: TRICUSPID VALVE REPAIR; Location: [place]   CATARACT EXTRACTION, BILATERAL     CHOLECYSTECTOMY     PACEMAKER BATTERY CHANGE N/A 2018   Procedure:  PACEMAKER BATTERY CHANGE; Location: [place]   PACEMAKER INSERTION N/A 2009   Procedure: PACEMAKER INSERTION; Location: [place]   TOTAL HIP ARTHROPLASTY Bilateral    TOTAL KNEE ARTHROPLASTY Right    No family history on file. Social History   Tobacco Use   Smoking status: Never   Smokeless tobacco: Never  Vaping Use   Vaping status: Never Used  Substance Use Topics   Alcohol use: No   Drug use: Never    Pertinent Clinical Results:  LABS:    Component Ref Range & Units 01/20/2023  Prothrombin Time 9.7 - 13.2 Sec 22.6 High   Prothrombin INR 2.0 - 3.0 2.1  Resulting Agency [facility]   Component Ref Range & Units 12/09/2022  WBC (White Blood Cell Count) 4.1 - 10.2 10^3/uL 8.2  RBC (Red Blood Cell Count) 4.04 - 5.48 10^6/uL 4.15  Hemoglobin 12.0 - 15.0 gm/dL 16.1 Low   Hematocrit 09.6 - 47.0 % 37.3  MCV (Mean Corpuscular Volume) 80.0 - 100.0 fl 89.9  MCH (Mean Corpuscular Hemoglobin) 27.0 - 31.2 pg 28.2  MCHC (Mean Corpuscular Hemoglobin Concentration) 32.0 - 36.0 gm/dL 04.5 Low   Platelet Count 150 - 450 10^3/uL 308  RDW-CV (Red Cell Distribution Width) 11.6 - 14.8 % 13.7  MPV (Mean Platelet Volume) 9.4 - 12.4 fl 9.1 Low   Neutrophils 1.50 - 7.80 10^3/uL 5.25  Lymphocytes 1.00 - 3.60 10^3/uL 1.87  Monocytes 0.00 - 1.50 10^3/uL 0.68  Eosinophils 0.00 -  0.55 10^3/uL 0.34  Basophils 0.00 - 0.09 10^3/uL 0.06  Neutrophil % 32.0 - 70.0 % 64.0  Lymphocyte % 10.0 - 50.0 % 22.7  Monocyte % 4.0 - 13.0 % 8.3  Eosinophil % 1.0 - 5.0 % 4.1  Basophil% 0.0 - 2.0 % 0.7  Immature Granulocyte % <=0.7 % 0.2  Immature Granulocyte Count <=0.06 10^3/L 0.02  Resulting Agency [facility]  Specimen Collected: 12/09/22 12:41   Performed by: [facility] Last Resulted: 12/09/22 12:57  Received From: [facility]  Result Received: 02/01/23 09:17   Component Ref Range & Units 12/09/2022  Glucose 70 -  110 mg/dL 97  Sodium 161 - 096 mmol/L 143  Potassium 3.6 - 5.1 mmol/L 3.8  Chloride 97 - 109 mmol/L 105  Carbon Dioxide (CO2) 22.0 - 32.0 mmol/L 29.3  Calcium 8.7 - 10.3 mg/dL 9.2  Urea Nitrogen (BUN) 7 - 25 mg/dL 15  Creatinine 0.6 - 1.1 mg/dL 0.8  Glomerular Filtration Rate (eGFR) >60 mL/min/1.73sq m 74  BUN/Crea Ratio 6.0 - 20.0 18.8  Anion Gap w/K 6.0 - 16.0 12.5  Resulting Agency [facility]  Specimen Collected: 12/09/22 12:41   Performed by: [facility] Last Resulted: 12/09/22 17:34  Received From: [facility]  Result Received: 02/01/23 09:17    ECG: Date: 02/09/2023 Time ECG obtained: 1434 PM Rate: 93 bpm Rhythm: AJR Axis (leads I and aVF): Normal Intervals: QRS 82 ms. QTc 447 ms. ST segment and T wave changes: Nonspecific ST abnormalities  Comparison: Previous tracing on 02/08/2015 showed ventricular paced rhythm at a rate of 100 bpm.    IMAGING / PROCEDURES: PULMONARY FUNCTION TESTING performed on 01/06/2023 SPIROMETRY: FVC was 1.90 liters, 66% of predicted FEV1 was 1.46, 67% of predicted FEV1 ratio was 76.61 FEF 25-75% liters per second was 69% of predicted LUNG VOLUMES: TLC was 78% of predicted RV was 96% of predicted DIFFUSION CAPACITY: DLCO was 59% of predicted DLCO/VA was 93% of predicted  CT RENAL STONE STUDY performed on 04/10/2022 BILATERAL nephrolithiasis. No evidence of ureteral calculi, hydronephrosis, or other acute findings.  CT CHEST W CONTRAST performed on 07/03/2021 No acute or active process evident.  Mild cardiomegaly.  Dual lead pacemaker.  Previous median sternotomy.  Aortic atherosclerosis.  MYOCARDIAL PERFUSION IMAGING STUDY (LEXISCAN) performed on 06/04/2021 Normal left ventricular systolic function with a hyperdynamic LVEF of 75% Normal regional wall motion and myocardial thickness No evidence of stress-induced myocardial ischemia or arrhythmia Study determined to be  normal  TRANSTHORACIC ECHOCARDIOGRAM performed on 06/04/2021 Normal left ventricular systolic function with an EF of >55% No regional wall motion abnormalities Mild MR, TR, and PR Normal gradients; no valvular stenosis No pericardial effusion  Impression and Plan:  Kelly Olsen has been referred for pre-anesthesia review and clearance prior to her undergoing the planned anesthetic and procedural courses. Available labs, pertinent testing, and imaging results were personally reviewed by me in preparation for upcoming operative/procedural course. [facility] medical record has been updated following extensive record review and patient interview with PAT staff.   This patient has been appropriately cleared by cardiology with an overall LOW risk of experiencing significant perioperative cardiovascular complications. Completed perioperative prescription for cardiac device management documentation completed by primary cardiology team and placed on patient's chart for review by the surgical/anesthetic team on the day of her procedure. Electrophysiology indicating that procedure should not interfere with planned surgical procedure. Beyond normal perioperative cardiovascular monitoring, there  are no recommendations from electrophysiology team that prompt further discussion/recommendations from industry representative.   Based on clinical review performed today (02/09/23), barring any significant acute changes in the patient's overall condition, it is anticipated that she will be able to proceed with the planned surgical intervention. Any acute changes in clinical condition may necessitate her procedure being postponed and/or cancelled. Patient will meet with anesthesia team (MD and/or CRNA) on the day of her procedure for preoperative evaluation/assessment. Questions regarding anesthetic course will be fielded at that time.   Pre-surgical instructions were reviewed with the patient during her PAT  appointment, and questions were fielded to satisfaction by PAT clinical staff. She has been instructed on which medications that she will need to hold prior to surgery, as well as the ones that have been deemed safe/appropriate to take on the day of her procedure. As part of the general education provided by PAT, patient made aware both verbally and in writing, that she would need to abstain from the use of any illegal substances during her perioperative course.  She was advised that failure to follow the provided instructions could necessitate case cancellation or result in serious perioperative complications up to and including death. Patient encouraged to contact PAT and/or her surgeon's office to discuss any questions or concerns that may arise prior to surgery; verbalized understanding.   Quentin Mulling, MSN, APRN, FNP-C, CEN [facility]  Peri-operative Services Nurse Practitioner Phone: [phone] Fax: [phone] 02/09/23 5:28 PM  NOTE: This note has been prepared using Dragon dictation software. Despite my best ability to proofread, there is always the potential that unintentional transcriptional errors may still occur from this process.

## 2023-02-08 ENCOUNTER — Encounter: Payer: Self-pay | Admitting: Surgery

## 2023-02-10 ENCOUNTER — Encounter: Payer: Self-pay | Admitting: Surgery

## 2023-02-10 ENCOUNTER — Ambulatory Visit: Payer: Medicare Other | Admitting: Urgent Care

## 2023-02-10 ENCOUNTER — Ambulatory Visit
Admission: RE | Admit: 2023-02-10 | Discharge: 2023-02-10 | Disposition: A | Discharge status: Home / Self Care | Payer: Medicare Other | Attending: Surgery | Admitting: Surgery

## 2023-02-10 ENCOUNTER — Other Ambulatory Visit: Payer: Self-pay

## 2023-02-10 ENCOUNTER — Encounter
Admission: RE | Disposition: A | Discharge status: Home / Self Care | Payer: Self-pay | Source: Home / Self Care | Attending: Surgery

## 2023-02-10 DIAGNOSIS — G5601 Carpal tunnel syndrome, right upper limb: Secondary | ICD-10-CM | POA: Diagnosis not present

## 2023-02-10 DIAGNOSIS — Z7901 Long term (current) use of anticoagulants: Secondary | ICD-10-CM | POA: Diagnosis not present

## 2023-02-10 HISTORY — DX: Atherosclerosis of aorta: I70.0

## 2023-02-10 HISTORY — DX: Cataract extraction status, right eye: Z98.41

## 2023-02-10 HISTORY — DX: Long term (current) use of anticoagulants: Z79.01

## 2023-02-10 HISTORY — DX: Rheumatic tricuspid insufficiency: I07.1

## 2023-02-10 HISTORY — DX: Unspecified mononeuropathy of bilateral lower limbs: G57.93

## 2023-02-10 HISTORY — PX: CARPAL TUNNEL RELEASE: SHX101

## 2023-02-10 HISTORY — DX: Eosinophilic asthma: J82.83

## 2023-02-10 HISTORY — DX: Sick sinus syndrome: I49.5

## 2023-02-10 HISTORY — DX: Cardiomegaly: I51.7

## 2023-02-10 HISTORY — DX: Carpal tunnel syndrome, bilateral upper limbs: G56.03

## 2023-02-10 LAB — PROTIME-INR
INR: 1.4 — ABNORMAL HIGH (ref 0.8–1.2)
Prothrombin Time: 17.4 seconds — ABNORMAL HIGH (ref 11.4–15.2)

## 2023-02-10 SURGERY — CARPAL TUNNEL RELEASE
Anesthesia: General | Site: Wrist | Laterality: Right

## 2023-02-10 MED ORDER — FENTANYL CITRATE (PF) 100 MCG/2ML IJ SOLN
INTRAMUSCULAR | Status: AC
  Filled 2023-02-10: qty 2

## 2023-02-10 MED ORDER — FENTANYL CITRATE (PF) 100 MCG/2ML IJ SOLN
INTRAMUSCULAR | Status: DC | PRN
  Administered 2023-02-10 (×4): 25 ug via INTRAVENOUS

## 2023-02-10 MED ORDER — ACETAMINOPHEN 10 MG/ML IV SOLN
INTRAVENOUS | Status: DC | PRN
  Administered 2023-02-10: 1000 mg via INTRAVENOUS

## 2023-02-10 MED ORDER — GLYCOPYRROLATE 0.2 MG/ML IJ SOLN
INTRAMUSCULAR | Status: DC | PRN
  Administered 2023-02-10: .2 mg via INTRAVENOUS

## 2023-02-10 MED ORDER — ORAL CARE MOUTH RINSE: 15.0000 mL | Freq: Once | OROMUCOSAL | Status: AC

## 2023-02-10 MED ORDER — PROPOFOL 10 MG/ML IV BOLUS
INTRAVENOUS | Status: AC
  Filled 2023-02-10: qty 20

## 2023-02-10 MED ORDER — DEXAMETHASONE SODIUM PHOSPHATE 10 MG/ML IJ SOLN
INTRAMUSCULAR | Status: DC | PRN
  Administered 2023-02-10: 10 mg via INTRAVENOUS

## 2023-02-10 MED ORDER — DEXAMETHASONE SODIUM PHOSPHATE 10 MG/ML IJ SOLN: INTRAMUSCULAR | Status: DC | PRN

## 2023-02-10 MED ORDER — 0.9 % SODIUM CHLORIDE (POUR BTL) OPTIME
TOPICAL | Status: DC | PRN
  Administered 2023-02-10: 500 mL

## 2023-02-10 MED ORDER — CEFAZOLIN SODIUM-DEXTROSE 2-4 GM/100ML-% IV SOLN
INTRAVENOUS | Status: AC
  Filled 2023-02-10: qty 100

## 2023-02-10 MED ORDER — CEFAZOLIN SODIUM-DEXTROSE 2-4 GM/100ML-% IV SOLN
2.0000 g | INTRAVENOUS | Status: AC
  Administered 2023-02-10: 2 g via INTRAVENOUS

## 2023-02-10 MED ORDER — ONDANSETRON HCL 4 MG/2ML IJ SOLN
INTRAMUSCULAR | Status: DC | PRN
  Administered 2023-02-10 (×2): 4 mg via INTRAVENOUS

## 2023-02-10 MED ORDER — CHLORHEXIDINE GLUCONATE 0.12 % MT SOLN
OROMUCOSAL | Status: AC
  Filled 2023-02-10: qty 15

## 2023-02-10 MED ORDER — BUPIVACAINE HCL (PF) 0.5 % IJ SOLN
INTRAMUSCULAR | Status: AC
  Filled 2023-02-10: qty 30

## 2023-02-10 MED ORDER — LACTATED RINGERS IV SOLN: INTRAVENOUS | Status: DC

## 2023-02-10 MED ORDER — LIDOCAINE HCL (CARDIAC) PF 100 MG/5ML IV SOSY
PREFILLED_SYRINGE | INTRAVENOUS | Status: DC | PRN
  Administered 2023-02-10: 80 mg via INTRAVENOUS

## 2023-02-10 MED ORDER — PROPOFOL 10 MG/ML IV BOLUS
INTRAVENOUS | Status: DC | PRN
  Administered 2023-02-10: 150 mg via INTRAVENOUS

## 2023-02-10 MED ORDER — BUPIVACAINE HCL (PF) 0.5 % IJ SOLN
INTRAMUSCULAR | Status: DC | PRN
  Administered 2023-02-10: 10 mL

## 2023-02-10 MED ORDER — CHLORHEXIDINE GLUCONATE 0.12 % MT SOLN
15.0000 mL | Freq: Once | OROMUCOSAL | Status: AC
  Administered 2023-02-10: 15 mL via OROMUCOSAL

## 2023-02-10 SURGICAL SUPPLY — 35 items
APL PRP STRL LF DISP 70% ISPRP (MISCELLANEOUS) ×2
BNDG CMPR 5X2 KNTD ELC UNQ LF (GAUZE/BANDAGES/DRESSINGS) ×1
BNDG CMPR 5X3 KNIT ELC UNQ LF (GAUZE/BANDAGES/DRESSINGS) ×1
BNDG ELASTIC 2INX 5YD STR LF (GAUZE/BANDAGES/DRESSINGS) ×1 IMPLANT
BNDG ELASTIC 3INX 5YD STR LF (GAUZE/BANDAGES/DRESSINGS) ×1 IMPLANT
BNDG ESMARCH 4 X 12 STRL LF (GAUZE/BANDAGES/DRESSINGS) ×1
BNDG ESMARCH 4X12 STRL LF (GAUZE/BANDAGES/DRESSINGS) ×1 IMPLANT
CHLORAPREP W/TINT 26 (MISCELLANEOUS) ×1 IMPLANT
CORD BIP STRL DISP 12FT (MISCELLANEOUS) ×1 IMPLANT
CUFF TOURN SGL QUICK 18X4 (TOURNIQUET CUFF) IMPLANT
DRAPE SURG 17X11 SM STRL (DRAPES) ×2 IMPLANT
FORCEPS JEWEL BIP 4-3/4 STR (INSTRUMENTS) ×1 IMPLANT
GAUZE SPONGE 4X4 12PLY STRL (GAUZE/BANDAGES/DRESSINGS) ×1 IMPLANT
GAUZE XEROFORM 1X8 LF (GAUZE/BANDAGES/DRESSINGS) ×1 IMPLANT
GLOVE BIO SURGEON STRL SZ8 (GLOVE) ×2 IMPLANT
GLOVE INDICATOR 8.0 STRL GRN (GLOVE) ×1 IMPLANT
GOWN STRL REUS W/ TWL LRG LVL3 (GOWN DISPOSABLE) ×1 IMPLANT
GOWN STRL REUS W/ TWL XL LVL3 (GOWN DISPOSABLE) ×1 IMPLANT
GOWN STRL REUS W/TWL LRG LVL3 (GOWN DISPOSABLE) ×1
GOWN STRL REUS W/TWL XL LVL3 (GOWN DISPOSABLE) ×1
KIT TURNOVER KIT A (KITS) ×1 IMPLANT
MANIFOLD NEPTUNE II (INSTRUMENTS) ×1 IMPLANT
NDL HYPO 25X1 1.5 SAFETY (NEEDLE) ×1 IMPLANT
NEEDLE HYPO 25X1 1.5 SAFETY (NEEDLE) ×1 IMPLANT
NS IRRIG 500ML POUR BTL (IV SOLUTION) ×1 IMPLANT
PACK EXTREMITY ARMC (MISCELLANEOUS) ×1 IMPLANT
SPLINT WRIST LG LT TX990309 (SOFTGOODS) ×1 IMPLANT
SPLINT WRIST LG RT TX900304 (SOFTGOODS) IMPLANT
SPLINT WRIST M LT TX990308 (SOFTGOODS) ×1 IMPLANT
SPLINT WRIST M RT TX990303 (SOFTGOODS) ×1 IMPLANT
STOCKINETTE IMPERVIOUS 9X36 MD (GAUZE/BANDAGES/DRESSINGS) ×1 IMPLANT
SUT PROLENE 4 0 PS 2 18 (SUTURE) ×1 IMPLANT
TRAP FLUID SMOKE EVACUATOR (MISCELLANEOUS) ×1 IMPLANT
VACURETTE 12 RIGID CVD (CANNULA) ×1 IMPLANT
WATER STERILE IRR 500ML POUR (IV SOLUTION) ×1 IMPLANT

## 2023-02-10 NOTE — H&P (Signed)
History of Present Illness:  Kelly Olsen is a 82 y.o. female who presents for evaluation and treatment of bilateral hand and wrist pain and paresthesias, right more symptomatic than left. The patient notes that the symptoms have been present for over 6 months and developed without any specific cause or injury. The patient notes that she is now approximately 35 years status post bilateral mini-open carpal tunnel releases from which she has done quite well. She saw Dr. Rosita Kea in February, 2024 who asked Dr. Landry Mellow to perform ultrasound-guided carpal tunnel injections. The patient notes that these injections did help for about 6 to 7 weeks before her symptoms began to recur. She rates her pain at 8/10 and describes the symptoms as "constant" but worse with repetitive activities, at night, and when driving. She had undergone an EMG by Dr. Malvin Johns in August, 2023 which confirmed the presence of moderate recurrent left carpal tunnel syndrome and mild recurrent right carpal tunnel syndrome. The patient has tried bracing, the injections noted above, and Tylenol, and activity modification, all with limited benefit.  Current Outpatient Medications:  albuterol (PROVENTIL) 2.5 mg /3 mL (0.083 %) nebulizer solution Take 3 mLs (2.5 mg total) by nebulization once daily for 90 days 75 mL 2  albuterol 90 mcg/actuation inhaler Inhale 2 inhalations into the lungs every 4 (four) hours as needed for Wheezing 1 each 0  budesonide (PULMICORT) 0.5 mg/2 mL nebulizer solution USE 1 VIAL IN NEBULIZER TWICE DAILY - Rinse Mouth After Treatment 1 mL 11  CALCIUM-VITAMIN D3 ORAL Take by mouth  carvediloL (COREG) 25 MG tablet Take 1 tablet (25 mg total) by mouth 2 (two) times daily with meals 180 tablet 3  DULoxetine (CYMBALTA) 60 MG DR capsule Take 1 capsule (60 mg total) by mouth once daily 30 capsule 5  dupilumab (DUPIXENT) 300 mg/2 mL pen injector Inject 2 mLs (300 mg total) subcutaneously every 14 (fourteen) days 4 mL 11   EPINEPHrine (EPIPEN) 0.3 mg/0.3 mL auto-injector Inject 0.3 mLs into the muscle once as needed  gabapentin (NEURONTIN) 100 MG capsule Increase gabapentin to 100mg  in the morning, 100mg  in the afternoon, and 600mg  at night. 180 capsule 1  gabapentin (NEURONTIN) 600 MG tablet TAKE 1 TABLET BY MOUTH AT NIGHT 90 tablet 1  inhalational spacer (AEROCHAMBER) spacer Please instruct patient on use 1 each 2  losartan (COZAAR) 50 MG tablet Take 1 tablet (50 mg total) by mouth once daily 90 tablet 3  montelukast (SINGULAIR) 10 mg tablet TAKE 1 TABLET(10 MG) BY MOUTH AT BEDTIME 90 tablet 0  multivitamin tablet Take 1 tablet by mouth once daily  omeprazole (PRILOSEC) 40 MG DR capsule Take 1 capsule (40 mg total) by mouth 2 (two) times daily 180 capsule 3  potassium 99 mg Tab Take 1 tablet by mouth 2 (two) times daily  TORsemide (DEMADEX) 20 MG tablet Take 1 tablet (20 mg total) by mouth 2 (two) times daily 180 tablet 1  warfarin (COUMADIN) 3 MG tablet TAKE 1 TABLET(3 MG) BY MOUTH EVERY DAY 90 tablet 1   Allergies:  Tramadol Other (Causes extreme sleepiness, "wooziness")  Amlodipine Rash and Other (See Comments)  Butalbital-Acetaminop-Caf-Cod Other (See Comments)  Codeine Vomiting and Other (See Comments)  Codeine Sulfate Unknown   Past Medical History:  Diagnosis Date  Atrial fibrillation (CMS/HHS-HCC)  COPD (chronic obstructive pulmonary disease) (CMS/HHS-HCC)  Hypertension   Past Surgical History:  KNEE ARTHROSCOPY 2004 (right knee)  CHOLECYSTECTOMY  CARPAL TUNNEL RELEASE (bilateral hands)  HIP ARTHROSCOPY  HYSTERECTOMY  INSERT /  REPLACE / REMOVE PACEMAKER 2009  INSERTION DUAL CHAMBER PACEMAKER GENERATOR  JOINT REPLACEMENT (left and right hip)   Family History:  Heart disease Mother  Heart disease Brother  High blood pressure (Hypertension) Brother   Social History:   Socioeconomic History:  Marital status: Divorced  Occupational History  Occupation: Retired  Tobacco Use  Smoking  status: Never  Smokeless tobacco: Never  Advertising account planner  Vaping status: Never Used  Substance and Sexual Activity  Alcohol use: Not Currently  Drug use: No  Sexual activity: Defer   Review of Systems:  A comprehensive 14 point ROS was performed, reviewed, and the pertinent orthopaedic findings are documented in the HPI.  Physical Exam: Vitals:  01/20/23 1024  BP: (!) 132/94  Weight: 82.2 kg (181 lb 3.2 oz)  Height: 172.7 cm (5\' 8" )  PainSc: 8  PainLoc: Hand   General/Constitutional: The patient appears to be well-nourished, well-developed, and in no acute distress. Neuro/Psych: Normal mood and affect, oriented to person, place and time. Eyes: Non-icteric. Pupils are equal, round, and reactive to light, and exhibit synchronous movement. ENT: Unremarkable. Lymphatic: No palpable adenopathy. Respiratory: Lungs clear to auscultation, Normal chest excursion, No wheezes, and Non-labored breathing Cardiovascular: Regular rate and rhythm. No murmurs. and No edema, swelling or tenderness, except as noted in detailed exam. Integumentary: No impressive skin lesions present, except as noted in detailed exam. Musculoskeletal: Unremarkable, except as noted in detailed exam.  Right wrist/hand exam: Skin inspection of the right wrist and hand is notable for well-healed surgical incision, but otherwise is unremarkable. No swelling, erythema, ecchymosis, abrasions, or other skin abnormalities are identified. She has somewhat limited wrist motion as she can tolerate extension to 30 degrees and flexion to 45 degrees with mild pain at the extremes of flexion and extension. She is able to actively flex and extend all digits fully without any pain or triggering. She is grossly neurovascularly intact all digits, although with some subjectively decreased sensation to light touch to the thumb, index, and long fingers.  X-rays/MRI/Lab data:  Recent AP, lateral, and oblique views of the right wrist and hand are  available for review and have been reviewed by myself. These films demonstrated advanced degenerative changes of all MCP joints as well as the thumb CMC and STT joints. The radiocarpal joint appears to be reasonably well-maintained. No lytic lesions or fractures are identified.  A recent EMG/NCV of both upper extremities is available for review and has been reviewed by myself. The findings demonstrate recurrent moderate carpal tunnel syndrome on the left, and mild recurrent carpal tunnel syndrome on the right.  Assessment: 1. Carpal tunnel syndrome, right.  Plan: The treatment options were discussed with the patient. In addition, patient educational materials were provided regarding the diagnosis and treatment options. The patient is quite frustrated by her wrist and hand symptoms, especially as they pertain to her right wrist/hand, and is ready to consider more aggressive treatment options. Therefore, I have recommended a surgical procedure, specifically an open redo right carpal tunnel release. The procedure was discussed with the patient, as were the potential risks (including bleeding, infection, nerve and/or blood vessel injury, persistent or recurrent pain/paresthesias, weakness of grip, progressive degenerative joint disease, need for further surgery, blood clots, strokes, heart attacks and/or arhythmias, pneumonia, etc.) and benefits. The patient states her understanding and wishes to proceed. All of the patient's questions and concerns were answered. She can call any time with further concerns. She will follow up post-surgery, routine.    H&P  reviewed and patient re-examined. No changes.

## 2023-02-10 NOTE — Anesthesia Procedure Notes (Signed)
Procedure Name: LMA Insertion Date/Time: 02/10/2023 7:33 AM  Performed by: Mohammed Kindle, CRNAPre-anesthesia Checklist: Patient identified, Emergency Drugs available, Suction available and Patient being monitored Patient Re-evaluated:Patient Re-evaluated prior to induction Oxygen Delivery Method: Circle system utilized Preoxygenation: Pre-oxygenation with 100% oxygen Induction Type: IV induction LMA: LMA inserted LMA Size: 3.0 Number of attempts: 1 Placement Confirmation: positive ETCO2, CO2 detector and breath sounds checked- equal and bilateral Tube secured with: Tape Dental Injury: Teeth and Oropharynx as per pre-operative assessment

## 2023-02-10 NOTE — Transfer of Care (Signed)
Immediate Anesthesia Transfer of Care Note  Patient: Kelly Olsen  Procedure(s) Performed: REVISION OPEN CARPAL TUNNEL RELEASE (Right: Wrist)  Patient Location: PACU  Anesthesia Type:General  Level of Consciousness: awake, drowsy, and patient cooperative  Airway & Oxygen Therapy: Patient Spontanous Breathing and Patient connected to face mask oxygen  Post-op Assessment: Report given to RN and Post -op Vital signs reviewed and stable  Post vital signs: Reviewed and stable  Last Vitals:  Vitals Value Taken Time  BP 157/73 02/10/23 0828  Temp    Pulse 59 02/10/23 0830  Resp 16 02/10/23 0830  SpO2 100 % 02/10/23 0830  Vitals shown include unfiled device data.  Last Pain:  Vitals:   02/10/23 0618  TempSrc: Temporal  PainSc: 0-No pain         Complications: No notable events documented.

## 2023-02-10 NOTE — Op Note (Signed)
02/10/2023  8:38 AM  Patient:   Kelly Olsen  Pre-Op Diagnosis:   Recurrent right carpal tunnel syndrome.  Post-Op Diagnosis:   Same.  Procedure:   Open right carpal tunnel release.  Surgeon:   Maryagnes Amos, MD  Anesthesia:   General LMA  Findings:   As above.  Complications:   None  EBL:   0 cc  Fluids:   200 cc crystalloid  TT:   30 minutes at 250 mmHg  Drains:   None  Closure:   4-0 Prolene interrupted sutures  Brief Clinical Note:   The patient is an 82 year old female with a 6+ month history of recurrent pain and paresthesias to her right hand. Her symptoms have progressed despite medications, activity modification, splinting, etc. She is now 36 years status post a mini open right carpal tunnel release. Her history and examination are consistent with recurrent carpal tunnel syndrome, which has been confirmed by EMG. The patient presents at this time for an open right carpal tunnel release.   Procedure:   The patient was brought into the operating room and lain in the supine position. After adequate general laryngeal mask anesthesia was achieved, the right hand and upper extremity were prepped with ChloraPrep solution before being draped sterilely. Preoperative antibiotics were administered. A timeout was performed to verify the appropriate surgical site before the limb was exsanguinated with an Esmarch and the tourniquet inflated to 250 mmHg.   Utilizing the previous incision on the volar aspect of the palm, the incision was extended proximally in a zigzag fashion over the wrist flexor crease into the distal forearm. The incision was carried down through the subcutaneous tissues with care taken to identify and protect any neurovascular structures. The distal forearm fascia was penetrated just proximal to the transverse carpal ligament. The soft tissues were released off the superficial and deep surfaces of the distal forearm fascia and this was released proximally for 3-4  cm under direct visualization. The nerve was visualized directly before a Therapist, nutritional was passed beneath the transverse carpal ligament along the ulnar aspect of the carpal tunnel and used to release any adhesions as well as to remove any adherent synovial tissue. It also was used to protect the underlying nerve as the recurrent scar tissue and residual transverse carpal ligament was transected in line with the incision. Several adhesions along the ulnar side of the nerve were released to allow the nerve to move more freely within the carpal tunnel. Hemostasis was achieved using bipolar electrocautery.  The wound was irrigated thoroughly with sterile saline solution before being closed using 4-0 Prolene interrupted sutures. A total of 10 cc of 0.5% plain Sensorcaine was injected in and around the incision before a sterile bulky dressing was applied to the wound. The patient was placed into a volar wrist splint before being awakened, extubated, and returned to the recovery room in satisfactory condition after tolerating the procedure well.

## 2023-02-10 NOTE — Anesthesia Preprocedure Evaluation (Signed)
Anesthesia Evaluation  Patient identified by MRN, date of birth, ID band Patient awake    Reviewed: Allergy & Precautions, H&P , NPO status , Patient's Chart, lab work & pertinent test results, reviewed documented beta blocker date and time   Airway Mallampati: III  TM Distance: >3 FB Neck ROM: full    Dental  (+) Dental Advidsory Given, Caps, Teeth Intact Permanent bridge on top:   Pulmonary neg shortness of breath, asthma , Continuous Positive Airway Pressure Ventilation neg sleep apnea, neg COPD, neg recent URI   Pulmonary exam normal breath sounds clear to auscultation       Cardiovascular Exercise Tolerance: Good hypertension, (-) angina (-) Past MI and (-) Cardiac Stents + dysrhythmias Atrial Fibrillation + pacemaker (-) Valvular Problems/Murmurs Rhythm:regular Rate:Normal     Neuro/Psych negative neurological ROS  negative psych ROS   GI/Hepatic Neg liver ROS,GERD  ,,  Endo/Other  negative endocrine ROS    Renal/GU negative Renal ROS  negative genitourinary   Musculoskeletal   Abdominal   Peds  Hematology negative hematology ROS (+)   Anesthesia Other Findings Past Medical History: No date: Aortic atherosclerosis (HCC) No date: Arthritis No date: Atrial fibrillation (HCC)     Comment:  a.) CHA2DS2VASc = 5 (age x2, sex, HTN, vascular disease               history);  b.) s/p cardiac ablations x 2 (2004 and 2006)               - records unavailable; c.) rate/rhythm maintained using               carvedilol; chronically anticoagulated with warfarin No date: Bilateral carpal tunnel syndrome     Comment:  a.) s/p BILATERAL release No date: COPD (chronic obstructive pulmonary disease) (HCC) No date: Eosinophilic asthma     Comment:  a.) on biologic/mAB therapy (dupilumab) No date: GERD (gastroesophageal reflux disease) 04/2022: History of 2019 novel coronavirus disease (COVID-19) No date: History of bilateral  cataract extraction No date: History of kidney stones No date: Hypertension No date: Long term current use of anticoagulant     Comment:  a.) warfarin No date: Mild cardiomegaly No date: Neuropathy of both feet 2009: Presence of permanent cardiac pacemaker     Comment:  a.) s/p placement of [employer] EL               L321/7228B in 2009 [place]); b.) battery changed in               2018 ([place]) No date: SA node dysfunction [facility])     Comment:  a.) s/p dual chamber PPM placement 2009 No date: Severe tricuspid valve insufficiency     Comment:  a.) s/p TV repair via median sternotomy in ~2005/2006               done in [place]   Reproductive/Obstetrics negative OB ROS                             Anesthesia Physical Anesthesia Plan  ASA: 3  Anesthesia Plan: General   Post-op Pain Management:    Induction: Intravenous  PONV Risk Score and Plan: 3 and Ondansetron, Dexamethasone and Treatment may vary due to age or medical condition  Airway Management Planned: LMA  Additional Equipment:   Intra-op Plan:   Post-operative Plan: Extubation in OR  Informed Consent: I have reviewed the patients History  and Physical, chart, labs and discussed the procedure including the risks, benefits and alternatives for the proposed anesthesia with the patient or authorized representative who has indicated his/her understanding and acceptance.     Dental Advisory Given  Plan Discussed with: Anesthesiologist, CRNA and Surgeon  Anesthesia Plan Comments:         Anesthesia Quick Evaluation

## 2023-02-10 NOTE — Discharge Instructions (Addendum)
Orthopedic discharge instructions: Keep dressing dry and intact. Keep hand elevated above heart level. May shower after dressing removed on postop day 4 (Sunday). Cover sutures with Band-Aids or reapply Ace wrap after drying off, then reapply Velcro splint. Apply ice to affected area frequently. Resume Coumadin tonight -take 2 tablets tonight, then resume 3 mg daily as of Thursday, 02/11/2023. Take ES Tylenol when needed.  Return for follow-up in 10-14 days or as scheduled.  AMBULATORY SURGERY  DISCHARGE INSTRUCTIONS   The drugs that you were given will stay in your system until tomorrow so for the next 24 hours you should not:  Drive an automobile Make any legal decisions Drink any alcoholic beverage   You may resume regular meals tomorrow.  Today it is better to start with liquids and gradually work up to solid foods.  You may eat anything you prefer, but it is better to start with liquids, then soup and crackers, and gradually work up to solid foods.   Please notify your doctor immediately if you have any unusual bleeding, trouble breathing, redness and pain at the surgery site, drainage, fever, or pain not relieved by medication.    Please contact your physician with any problems or Same Day Surgery at [phone], Monday through Friday 6 am to 4 pm, or [place] at [facility] number at [phone].

## 2023-02-11 ENCOUNTER — Encounter: Payer: Self-pay | Admitting: Surgery

## 2023-02-17 NOTE — Anesthesia Postprocedure Evaluation (Signed)
Anesthesia Post Note  Patient: Kelly Olsen  Procedure(s) Performed: REVISION OPEN CARPAL TUNNEL RELEASE (Right: Wrist)  Patient location during evaluation: PACU Anesthesia Type: General Level of consciousness: awake and alert Pain management: pain level controlled Vital Signs Assessment: post-procedure vital signs reviewed and stable Respiratory status: spontaneous breathing, nonlabored ventilation, respiratory function stable and patient connected to nasal cannula oxygen Cardiovascular status: blood pressure returned to baseline and stable Postop Assessment: no apparent nausea or vomiting Anesthetic complications: no   No notable events documented.   Last Vitals:  Vitals:   02/10/23 0900 02/10/23 0918  BP: 136/64 135/74  Pulse: 66 68  Resp: 17 16  Temp: (!) 36.3 C 36.6 C  SpO2: 93% 94%    Last Pain:  Vitals:   02/10/23 0918  TempSrc: Temporal  PainSc: 0-No pain                 Lenard Simmer

## 2023-02-19 ENCOUNTER — Encounter: Payer: Self-pay | Admitting: Surgery

## 2023-03-18 DIAGNOSIS — M353 Polymyalgia rheumatica: Secondary | ICD-10-CM | POA: Diagnosis present

## 2023-03-23 ENCOUNTER — Other Ambulatory Visit: Payer: Self-pay | Admitting: Surgery

## 2023-03-24 ENCOUNTER — Encounter
Admission: RE | Admit: 2023-03-24 | Discharge: 2023-03-24 | Disposition: A | Discharge status: Home / Self Care | Payer: Medicare Other | Source: Ambulatory Visit | Attending: Surgery | Admitting: Surgery

## 2023-03-24 VITALS — Ht 68.0 in | Wt 177.9 lb

## 2023-03-24 DIAGNOSIS — Z7901 Long term (current) use of anticoagulants: Secondary | ICD-10-CM

## 2023-03-24 DIAGNOSIS — I10 Essential (primary) hypertension: Secondary | ICD-10-CM

## 2023-03-24 DIAGNOSIS — Z01812 Encounter for preprocedural laboratory examination: Secondary | ICD-10-CM

## 2023-03-24 DIAGNOSIS — Z79899 Other long term (current) drug therapy: Secondary | ICD-10-CM

## 2023-03-24 NOTE — Patient Instructions (Signed)
Your procedure is scheduled on:03-31-23 Wednesday Report to the Registration Desk on the 1st floor of the [facility].Then proceed to the 2nd floor Surgery Desk To find out your arrival time, please call [phone] between 1PM - 3PM on:03-30-23 Tuesday If your arrival time is 6:00 am, do not arrive before that time as the [facility] entrance doors do not open until 6:00 am.  REMEMBER: Instructions that are not followed completely may result in serious medical risk, up to and including death; or upon the discretion of your surgeon and anesthesiologist your surgery may need to be rescheduled.  Do not eat food after midnight the night before surgery.  No gum chewing or hard candies.  You may however, drink CLEAR liquids up to 2 hours before you are scheduled to arrive for your surgery. Do not drink anything within 2 hours of your scheduled arrival time.  Clear liquids include: - water  - apple juice without pulp - gatorade (not RED colors) - black coffee or tea (Do NOT add milk or creamers to the coffee or tea) Do NOT drink anything that is not on this list.  In addition, your doctor has ordered for you to drink the provided:  Ensure Pre-Surgery Clear Carbohydrate Drink  Drinking this carbohydrate drink up to two hours before surgery helps to reduce insulin resistance and improve patient outcomes. Please complete drinking 2 hours before scheduled arrival time.  One week prior to surgery: Stop Anti-inflammatories (NSAIDS) such as Advil, Aleve, Ibuprofen, Motrin, Naproxen, Naprosyn and Aspirin based products such as Excedrin, Goody's Powder, BC Powder.You may however, take Tylenol if needed for pain up until the day of surgery. Stop ANY OVER THE COUNTER supplements/vitamins NOW (03-24-23) until after surgery (Calcium-Vitamin D, Multivitamin and Potassium)   Continue taking all prescribed medications with the exception of the following: -warfarin (COUMADIN)-Stop 3 days prior to surgery-Last  dose will be on 03-27-23 Saturday  TAKE ONLY THESE MEDICATIONS THE MORNING OF SURGERY WITH A SIP OF WATER: -carvedilol (COREG)  -omeprazole (PRILOSEC)  Use your budesonide (PULMICORT) Nebulizer and your Combivent Inhaler the morning of surgery and bring your Combivent Inhaler to the hospital  No Alcohol for 24 hours before or after surgery.  No Smoking including e-cigarettes for 24 hours before surgery.  No chewable tobacco products for at least 6 hours before surgery.  No nicotine patches on the day of surgery.  Do not use any "recreational" drugs for at least a week (preferably 2 weeks) before your surgery.  Please be advised that the combination of cocaine and anesthesia may have negative outcomes, up to and including death. If you test positive for cocaine, your surgery will be cancelled.  On the morning of surgery brush your teeth with toothpaste and water, you may rinse your mouth with mouthwash if you wish. Do not swallow any toothpaste or mouthwash.  Use CHG Soap as directed on instruction sheet.  Do not wear jewelry, make-up, hairpins, clips or nail polish.  Do not wear lotions, powders, or perfumes.   Do not shave body hair from the neck down 48 hours before surgery.  Contact lenses, hearing aids and dentures may not be worn into surgery.  Do not bring valuables to the hospital. [facility] is not responsible for any missing/lost belongings or valuables.    Notify your doctor if there is any change in your medical condition (cold, fever, infection).  Wear comfortable clothing (specific to your surgery type) to the hospital.  After surgery, you can help  prevent lung complications by doing breathing exercises.  Take deep breaths and cough every 1-2 hours. Your doctor may order a device called an Incentive Spirometer to help you take deep breaths. When coughing or sneezing, hold a pillow firmly against your incision with both hands. This is called "splinting." Doing this  helps protect your incision. It also decreases belly discomfort.  If you are being admitted to the hospital overnight, leave your suitcase in the car. After surgery it may be brought to your room.  In case of increased patient census, it may be necessary for you, the patient, to continue your postoperative care in the Same Day Surgery department.  If you are being discharged the day of surgery, you will not be allowed to drive home. You will need a responsible individual to drive you home and stay with you for 24 hours after surgery.   If you are taking public transportation, you will need to have a responsible individual with you.  Please call the Pre-admissions Testing Dept. at [phone] if you have any questions about these instructions.  Surgery Visitation Policy:  Patients having surgery or a procedure may have two visitors.  Children under the age of 11 must have an adult with them who is not the patient.     Preparing for Surgery with CHLORHEXIDINE GLUCONATE (CHG) Soap  Chlorhexidine Gluconate (CHG) Soap  o An antiseptic cleaner that kills germs and bonds with the skin to continue killing germs even after washing  o Used for showering the night before surgery and morning of surgery  Before surgery, you can play an important role by reducing the number of germs on your skin.  CHG (Chlorhexidine gluconate) soap is an antiseptic cleanser which kills germs and bonds with the skin to continue killing germs even after washing.  Please do not use if you have an allergy to CHG or antibacterial soaps. If your skin becomes reddened/irritated stop using the CHG.  1. Shower the NIGHT BEFORE SURGERY and the MORNING OF SURGERY with CHG soap.  2. If you choose to wash your hair, wash your hair first as usual with your normal shampoo.  3. After shampooing, rinse your hair and body thoroughly to remove the shampoo.  4. Use CHG as you would any other liquid soap. You can apply CHG  directly to the skin and wash gently with a scrungie or a clean washcloth.  5. Apply the CHG soap to your body only from the neck down. Do not use on open wounds or open sores. Avoid contact with your eyes, ears, mouth, and genitals (private parts). Wash face and genitals (private parts) with your normal soap.  6. Wash thoroughly, paying special attention to the area where your surgery will be performed.  7. Thoroughly rinse your body with warm water.  8. Do not shower/wash with your normal soap after using and rinsing off the CHG soap.  9. Pat yourself dry with a clean towel.  10. Wear clean pajamas to bed the night before surgery.  12. Place clean sheets on your bed the night of your first shower and do not sleep with pets.  13. Shower again with the CHG soap on the day of surgery prior to arriving at the hospital.  14. Do not apply any deodorants/lotions/powders.  15. Please wear clean clothes to the hospital.  How to Use an Incentive Spirometer An incentive spirometer is a tool that measures how well you are filling your lungs with each breath. Learning  to take long, deep breaths using this tool can help you keep your lungs clear and active. This may help to reverse or lessen your chance of developing breathing (pulmonary) problems, especially infection. You may be asked to use a spirometer: After a surgery. If you have a lung problem or a history of smoking. After a long period of time when you have been unable to move or be active. If the spirometer includes an indicator to show the highest number that you have reached, your health care provider or respiratory therapist will help you set a goal. Keep a log of your progress as told by your health care provider. What are the risks? Breathing too quickly may cause dizziness or cause you to pass out. Take your time so you do not get dizzy or light-headed. If you are in pain, you may need to take pain medicine before doing incentive  spirometry. It is harder to take a deep breath if you are having pain. How to use your incentive spirometer  Sit up on the edge of your bed or on a chair. Hold the incentive spirometer so that it is in an upright position. Before you use the spirometer, breathe out normally. Place the mouthpiece in your mouth. Make sure your lips are closed tightly around it. Breathe in slowly and as deeply as you can through your mouth, causing the piston or the ball to rise toward the top of the chamber. Hold your breath for 3-5 seconds, or for as long as possible. If the spirometer includes a coach indicator, use this to guide you in breathing. Slow down your breathing if the indicator goes above the marked areas. Remove the mouthpiece from your mouth and breathe out normally. The piston or ball will return to the bottom of the chamber. Rest for a few seconds, then repeat the steps 10 or more times. Take your time and take a few normal breaths between deep breaths so that you do not get dizzy or light-headed. Do this every 1-2 hours when you are awake. If the spirometer includes a goal marker to show the highest number you have reached (best effort), use this as a goal to work toward during each repetition. After each set of 10 deep breaths, cough a few times. This will help to make sure that your lungs are clear. If you have an incision on your chest or abdomen from surgery, place a pillow or a rolled-up towel firmly against the incision when you cough. This can help to reduce pain while taking deep breaths and coughing. General tips When you are able to get out of bed: Walk around often. Continue to take deep breaths and cough in order to clear your lungs. Keep using the incentive spirometer until your health care provider says it is okay to stop using it. If you have been in the hospital, you may be told to keep using the spirometer at home. Contact a health care provider if: You are having difficulty  using the spirometer. You have trouble using the spirometer as often as instructed. Your pain medicine is not giving enough relief for you to use the spirometer as told. You have a fever. Get help right away if: You develop shortness of breath. You develop a cough with bloody mucus from the lungs. You have fluid or blood coming from an incision site after you cough. Summary An incentive spirometer is a tool that can help you learn to take long, deep breaths to keep your  lungs clear and active. You may be asked to use a spirometer after a surgery, if you have a lung problem or a history of smoking, or if you have been inactive for a long period of time. Use your incentive spirometer as instructed every 1-2 hours while you are awake. If you have an incision on your chest or abdomen, place a pillow or a rolled-up towel firmly against your incision when you cough. This will help to reduce pain. Get help right away if you have shortness of breath, you cough up bloody mucus, or blood comes from your incision when you cough. This information is not intended to replace advice given to you by your health care provider. Make sure you discuss any questions you have with your health care provider. Document Revised: 10/02/2019 Document Reviewed: 10/02/2019 Elsevier Patient Education  2024 [employer].

## 2023-03-26 ENCOUNTER — Encounter: Payer: Self-pay | Admitting: Urgent Care

## 2023-03-26 ENCOUNTER — Encounter
Admission: RE | Admit: 2023-03-26 | Discharge: 2023-03-26 | Disposition: A | Discharge status: Home / Self Care | Payer: Medicare Other | Source: Ambulatory Visit | Attending: Surgery | Admitting: Surgery

## 2023-03-26 DIAGNOSIS — Z01812 Encounter for preprocedural laboratory examination: Secondary | ICD-10-CM | POA: Diagnosis not present

## 2023-03-26 DIAGNOSIS — I10 Essential (primary) hypertension: Secondary | ICD-10-CM | POA: Diagnosis not present

## 2023-03-26 DIAGNOSIS — Z79899 Other long term (current) drug therapy: Secondary | ICD-10-CM | POA: Diagnosis not present

## 2023-03-26 LAB — BASIC METABOLIC PANEL
Anion gap: 13 (ref 5–15)
BUN: 14 mg/dL (ref 8–23)
CO2: 24 mmol/L (ref 22–32)
Calcium: 9.2 mg/dL (ref 8.9–10.3)
Chloride: 102 mmol/L (ref 98–111)
Creatinine, Ser: 0.95 mg/dL (ref 0.44–1.00)
GFR, Estimated: >60 mL/min (ref >60)
Glucose, Bld: 124 mg/dL — ABNORMAL HIGH (ref 70–99)
Potassium: 3.5 mmol/L (ref 3.5–5.1)
Sodium: 139 mmol/L (ref 135–145)

## 2023-03-31 ENCOUNTER — Ambulatory Visit: Payer: Medicare Other | Admitting: Urgent Care

## 2023-03-31 ENCOUNTER — Ambulatory Visit
Admission: RE | Admit: 2023-03-31 | Discharge: 2023-03-31 | Disposition: A | Discharge status: Home / Self Care | Payer: Medicare Other | Attending: Surgery | Admitting: Surgery

## 2023-03-31 ENCOUNTER — Encounter: Payer: Self-pay | Admitting: Surgery

## 2023-03-31 ENCOUNTER — Encounter
Admission: RE | Disposition: A | Discharge status: Home / Self Care | Payer: Self-pay | Source: Home / Self Care | Attending: Surgery

## 2023-03-31 ENCOUNTER — Other Ambulatory Visit: Payer: Self-pay

## 2023-03-31 DIAGNOSIS — Z79899 Other long term (current) drug therapy: Secondary | ICD-10-CM | POA: Insufficient documentation

## 2023-03-31 DIAGNOSIS — Z950 Presence of cardiac pacemaker: Secondary | ICD-10-CM | POA: Diagnosis not present

## 2023-03-31 DIAGNOSIS — J4489 Other specified chronic obstructive pulmonary disease: Secondary | ICD-10-CM | POA: Diagnosis not present

## 2023-03-31 DIAGNOSIS — Z7901 Long term (current) use of anticoagulants: Secondary | ICD-10-CM | POA: Diagnosis not present

## 2023-03-31 DIAGNOSIS — G5602 Carpal tunnel syndrome, left upper limb: Secondary | ICD-10-CM | POA: Insufficient documentation

## 2023-03-31 DIAGNOSIS — Z01812 Encounter for preprocedural laboratory examination: Secondary | ICD-10-CM

## 2023-03-31 DIAGNOSIS — I110 Hypertensive heart disease with heart failure: Secondary | ICD-10-CM | POA: Insufficient documentation

## 2023-03-31 DIAGNOSIS — I4891 Unspecified atrial fibrillation: Secondary | ICD-10-CM | POA: Diagnosis not present

## 2023-03-31 DIAGNOSIS — I5032 Chronic diastolic (congestive) heart failure: Secondary | ICD-10-CM | POA: Diagnosis not present

## 2023-03-31 HISTORY — PX: CARPAL TUNNEL RELEASE: SHX101

## 2023-03-31 LAB — PROTIME-INR
INR: 1.3 — ABNORMAL HIGH (ref 0.8–1.2)
Prothrombin Time: 16.9 s — ABNORMAL HIGH (ref 11.4–15.2)

## 2023-03-31 SURGERY — CARPAL TUNNEL RELEASE
Anesthesia: General | Site: Wrist | Laterality: Left

## 2023-03-31 MED ORDER — FENTANYL CITRATE (PF) 100 MCG/2ML IJ SOLN
INTRAMUSCULAR | Status: AC
  Filled 2023-03-31: qty 2

## 2023-03-31 MED ORDER — ORAL CARE MOUTH RINSE: 15.0000 mL | Freq: Once | OROMUCOSAL | Status: AC

## 2023-03-31 MED ORDER — ACETAMINOPHEN 325 MG PO TABS: 325.0000 mg | ORAL_TABLET | Freq: Four times a day (QID) | ORAL | Status: DC | PRN

## 2023-03-31 MED ORDER — 0.9 % SODIUM CHLORIDE (POUR BTL) OPTIME
TOPICAL | Status: DC | PRN
  Administered 2023-03-31: 500 mL

## 2023-03-31 MED ORDER — CEFAZOLIN SODIUM-DEXTROSE 2-4 GM/100ML-% IV SOLN
2.0000 g | INTRAVENOUS | Status: AC
  Administered 2023-03-31: 2 g via INTRAVENOUS

## 2023-03-31 MED ORDER — PHENYLEPHRINE 80 MCG/ML (10ML) SYRINGE FOR IV PUSH (FOR BLOOD PRESSURE SUPPORT)
PREFILLED_SYRINGE | INTRAVENOUS | Status: AC
  Filled 2023-03-31: qty 10

## 2023-03-31 MED ORDER — OXYCODONE HCL 5 MG/5ML PO SOLN: 5.0000 mg | Freq: Once | ORAL | Status: DC | PRN

## 2023-03-31 MED ORDER — DEXAMETHASONE SODIUM PHOSPHATE 10 MG/ML IJ SOLN
INTRAMUSCULAR | Status: AC
  Filled 2023-03-31: qty 1

## 2023-03-31 MED ORDER — ONDANSETRON HCL 4 MG/2ML IJ SOLN
INTRAMUSCULAR | Status: DC | PRN
  Administered 2023-03-31: 4 mg via INTRAVENOUS

## 2023-03-31 MED ORDER — FENTANYL CITRATE (PF) 100 MCG/2ML IJ SOLN: 25.0000 ug | INTRAMUSCULAR | Status: DC | PRN

## 2023-03-31 MED ORDER — MIDAZOLAM HCL 2 MG/2ML IJ SOLN
INTRAMUSCULAR | Status: AC
  Filled 2023-03-31: qty 2

## 2023-03-31 MED ORDER — LIDOCAINE HCL (PF) 2 % IJ SOLN
INTRAMUSCULAR | Status: AC
  Filled 2023-03-31: qty 5

## 2023-03-31 MED ORDER — LIDOCAINE HCL (CARDIAC) PF 100 MG/5ML IV SOSY
PREFILLED_SYRINGE | INTRAVENOUS | Status: DC | PRN
  Administered 2023-03-31: 60 mg via INTRAVENOUS

## 2023-03-31 MED ORDER — DEXAMETHASONE SODIUM PHOSPHATE 10 MG/ML IJ SOLN
INTRAMUSCULAR | Status: DC | PRN
  Administered 2023-03-31: 8 mg via INTRAVENOUS

## 2023-03-31 MED ORDER — ONDANSETRON HCL 4 MG PO TABS: 4.0000 mg | ORAL_TABLET | Freq: Four times a day (QID) | ORAL | Status: DC | PRN

## 2023-03-31 MED ORDER — PROPOFOL 1000 MG/100ML IV EMUL
INTRAVENOUS | Status: AC
  Filled 2023-03-31: qty 100

## 2023-03-31 MED ORDER — PROPOFOL 10 MG/ML IV BOLUS
INTRAVENOUS | Status: DC | PRN
  Administered 2023-03-31: 120 mg via INTRAVENOUS
  Administered 2023-03-31: 30 mg via INTRAVENOUS

## 2023-03-31 MED ORDER — METOCLOPRAMIDE HCL 5 MG/ML IJ SOLN: 5.0000 mg | Freq: Three times a day (TID) | INTRAMUSCULAR | Status: DC | PRN

## 2023-03-31 MED ORDER — BUPIVACAINE HCL (PF) 0.5 % IJ SOLN
INTRAMUSCULAR | Status: DC | PRN
  Administered 2023-03-31: 10 mL

## 2023-03-31 MED ORDER — ONDANSETRON HCL 4 MG/2ML IJ SOLN: 4.0000 mg | Freq: Once | INTRAMUSCULAR | Status: DC | PRN

## 2023-03-31 MED ORDER — METOCLOPRAMIDE HCL 10 MG PO TABS: 5.0000 mg | ORAL_TABLET | Freq: Three times a day (TID) | ORAL | Status: DC | PRN

## 2023-03-31 MED ORDER — MIDAZOLAM HCL 2 MG/2ML IJ SOLN
INTRAMUSCULAR | Status: DC | PRN
  Administered 2023-03-31: 1 mg via INTRAVENOUS

## 2023-03-31 MED ORDER — OXYCODONE HCL 5 MG PO TABS: 5.0000 mg | ORAL_TABLET | Freq: Once | ORAL | Status: DC | PRN

## 2023-03-31 MED ORDER — LACTATED RINGERS IV SOLN: INTRAVENOUS | Status: DC

## 2023-03-31 MED ORDER — ACETAMINOPHEN 10 MG/ML IV SOLN: 1000.0000 mg | Freq: Once | INTRAVENOUS | Status: DC | PRN

## 2023-03-31 MED ORDER — BUPIVACAINE HCL (PF) 0.5 % IJ SOLN
INTRAMUSCULAR | Status: AC
  Filled 2023-03-31: qty 30

## 2023-03-31 MED ORDER — CEFAZOLIN SODIUM-DEXTROSE 2-4 GM/100ML-% IV SOLN
INTRAVENOUS | Status: AC
  Filled 2023-03-31: qty 100

## 2023-03-31 MED ORDER — CHLORHEXIDINE GLUCONATE 0.12 % MT SOLN
15.0000 mL | Freq: Once | OROMUCOSAL | Status: AC
  Administered 2023-03-31: 15 mL via OROMUCOSAL

## 2023-03-31 MED ORDER — SODIUM CHLORIDE 0.9 % IV SOLN: INTRAVENOUS | Status: DC

## 2023-03-31 MED ORDER — PHENYLEPHRINE 80 MCG/ML (10ML) SYRINGE FOR IV PUSH (FOR BLOOD PRESSURE SUPPORT)
PREFILLED_SYRINGE | INTRAVENOUS | Status: DC | PRN
  Administered 2023-03-31: 80 ug via INTRAVENOUS

## 2023-03-31 MED ORDER — PROPOFOL 10 MG/ML IV BOLUS
INTRAVENOUS | Status: AC
  Filled 2023-03-31: qty 20

## 2023-03-31 MED ORDER — ONDANSETRON HCL 4 MG/2ML IJ SOLN
INTRAMUSCULAR | Status: AC
  Filled 2023-03-31: qty 2

## 2023-03-31 MED ORDER — ONDANSETRON HCL 4 MG/2ML IJ SOLN: 4.0000 mg | Freq: Four times a day (QID) | INTRAMUSCULAR | Status: DC | PRN

## 2023-03-31 MED ORDER — CHLORHEXIDINE GLUCONATE 0.12 % MT SOLN
OROMUCOSAL | Status: AC
  Filled 2023-03-31: qty 15

## 2023-03-31 SURGICAL SUPPLY — 34 items
APL PRP STRL LF DISP 70% ISPRP (MISCELLANEOUS) ×1
BNDG CMPR 5X2 KNTD ELC UNQ LF (GAUZE/BANDAGES/DRESSINGS) ×1
BNDG CMPR 5X3 KNIT ELC UNQ LF (GAUZE/BANDAGES/DRESSINGS) ×1
BNDG ELASTIC 2INX 5YD STR LF (GAUZE/BANDAGES/DRESSINGS) ×1 IMPLANT
BNDG ELASTIC 3INX 5YD STR LF (GAUZE/BANDAGES/DRESSINGS) ×1 IMPLANT
BNDG ESMARCH 4 X 12 STRL LF (GAUZE/BANDAGES/DRESSINGS) ×1
BNDG ESMARCH 4X12 STRL LF (GAUZE/BANDAGES/DRESSINGS) ×1 IMPLANT
CHLORAPREP W/TINT 26 (MISCELLANEOUS) ×1 IMPLANT
CORD BIP STRL DISP 12FT (MISCELLANEOUS) ×1 IMPLANT
CUFF TOURN SGL QUICK 18X4 (TOURNIQUET CUFF) IMPLANT
DRAPE SURG 17X11 SM STRL (DRAPES) ×2 IMPLANT
FORCEPS JEWEL BIP 4-3/4 STR (INSTRUMENTS) ×1 IMPLANT
GAUZE SPONGE 4X4 12PLY STRL (GAUZE/BANDAGES/DRESSINGS) ×1 IMPLANT
GAUZE XEROFORM 1X8 LF (GAUZE/BANDAGES/DRESSINGS) ×1 IMPLANT
GLOVE BIO SURGEON STRL SZ8 (GLOVE) ×2 IMPLANT
GLOVE INDICATOR 8.0 STRL GRN (GLOVE) ×1 IMPLANT
GOWN STRL REUS W/ TWL LRG LVL3 (GOWN DISPOSABLE) ×1 IMPLANT
GOWN STRL REUS W/ TWL XL LVL3 (GOWN DISPOSABLE) ×1 IMPLANT
GOWN STRL REUS W/TWL LRG LVL3 (GOWN DISPOSABLE) ×1
GOWN STRL REUS W/TWL XL LVL3 (GOWN DISPOSABLE) ×1
KIT TURNOVER KIT A (KITS) ×1 IMPLANT
MANIFOLD NEPTUNE II (INSTRUMENTS) ×1 IMPLANT
NDL HYPO 25X1 1.5 SAFETY (NEEDLE) ×1 IMPLANT
NEEDLE HYPO 25X1 1.5 SAFETY (NEEDLE) ×1 IMPLANT
NS IRRIG 500ML POUR BTL (IV SOLUTION) ×1 IMPLANT
PACK EXTREMITY ARMC (MISCELLANEOUS) ×1 IMPLANT
SPLINT WRIST LG LT TX990309 (SOFTGOODS) ×1 IMPLANT
SPLINT WRIST M LT TX990308 (SOFTGOODS) ×1 IMPLANT
SPLINT WRIST M RT TX990303 (SOFTGOODS) ×1 IMPLANT
STOCKINETTE IMPERVIOUS 9X36 MD (GAUZE/BANDAGES/DRESSINGS) ×1 IMPLANT
SUT PROLENE 4 0 PS 2 18 (SUTURE) ×1 IMPLANT
TRAP FLUID SMOKE EVACUATOR (MISCELLANEOUS) ×1 IMPLANT
VACURETTE 12 RIGID CVD (CANNULA) ×1 IMPLANT
WATER STERILE IRR 500ML POUR (IV SOLUTION) ×1 IMPLANT

## 2023-03-31 NOTE — H&P (Signed)
History of Present Illness:  Kelly Olsen is a 82 y.o. female who presents for evaluation and treatment of continued pain and paresthesias to the left hand. Again, the patient is now approximately 35 years status post a mini open left carpal tunnel release from which she had done quite well for many years. An EMG from last year confirmed the presence of moderate recurrent left carpal tunnel syndrome. Her symptoms have persisted despite medications, activity modification, bracing, and a steroid injection. She is ready to consider more aggressive treatment options for her left wrist.  Current Outpatient Medications:  albuterol 90 mcg/actuation inhaler Inhale 2 inhalations into the lungs every 4 (four) hours as needed for Wheezing 1 each 0  budesonide (PULMICORT) 0.5 mg/2 mL nebulizer solution USE 1 VIAL IN NEBULIZER TWICE DAILY - Rinse Mouth After Treatment 1 mL 11  CALCIUM-VITAMIN D3 ORAL Take by mouth  carvediloL (COREG) 25 MG tablet Take 1 tablet (25 mg total) by mouth 2 (two) times daily with meals 180 tablet 3  DULoxetine (CYMBALTA) 60 MG DR capsule Take 1 capsule (60 mg total) by mouth once daily 30 capsule 5  dupilumab (DUPIXENT) 300 mg/2 mL pen injector Inject 2 mLs (300 mg total) subcutaneously every 14 (fourteen) days 4 mL 11  EPINEPHrine (EPIPEN) 0.3 mg/0.3 mL auto-injector Inject 0.3 mLs into the muscle once as needed  gabapentin (NEURONTIN) 600 MG tablet TAKE 1 TABLET BY MOUTH AT NIGHT 90 tablet 1  inhalational spacer (AEROCHAMBER) spacer Please instruct patient on use 1 each 2  ipratropium-albuteroL (COMBIVENT RESPIMAT) 20-100 mcg/actuation inhaler Inhale into the lungs  losartan (COZAAR) 50 MG tablet Take 1 tablet (50 mg total) by mouth once daily 90 tablet 3  montelukast (SINGULAIR) 10 mg tablet TAKE 1 TABLET(10 MG) BY MOUTH AT BEDTIME 90 tablet 0  multivitamin tablet Take 1 tablet by mouth once daily  omeprazole (PRILOSEC) 40 MG DR capsule Take 1 capsule (40 mg total) by mouth 2  (two) times daily 180 capsule 3  potassium 99 mg Tab Take 1 tablet by mouth 2 (two) times daily  predniSONE (DELTASONE) 5 MG tablet Take 4 tabs every morning X 2 weeks; 3.5 tabs every morning X 2 weeks; 3 tabs every morning X 2 weeks 120 tablet 1  TORsemide (DEMADEX) 20 MG tablet Take 1 tablet (20 mg total) by mouth 2 (two) times daily 180 tablet 3  warfarin (COUMADIN) 3 MG tablet TAKE 1 TABLET(3 MG) BY MOUTH EVERY DAY 90 tablet 1  albuterol (PROVENTIL) 2.5 mg /3 mL (0.083 %) nebulizer solution Take 3 mLs (2.5 mg total) by nebulization once daily for 90 days 75 mL 2   Allergies:  Tramadol Other (Causes extreme sleepiness, "wooziness")  Amlodipine Rash and Other (See Comments)  Butalbital-Acetaminop-Caf-Cod Other (See Comments)  Codeine Vomiting and Other (See Comments)  Codeine Sulfate Unknown   Past Medical History:  Atrial fibrillation (CMS/HHS-HCC)  COPD (chronic obstructive pulmonary disease) (CMS/HHS-HCC)  Hypertension   Past Surgical History:  KNEE ARTHROSCOPY 2004 (right knee)  Open right carpal tunnel release. Right 02/10/2023 (Dr. Joice Lofts)  CHOLECYSTECTOMY  ENDOSCOPIC CARPAL TUNNEL RELEASE (bilateral hands)  HIP ARTHROSCOPY  HYSTERECTOMY  INSERT / REPLACE / REMOVE PACEMAKER 2009  INSERTION DUAL CHAMBER PACEMAKER GENERATOR  JOINT REPLACEMENT (left and right hip)   Family History:  Heart disease Mother  Heart disease Brother  High blood pressure (Hypertension) Brother   Social History:   Socioeconomic History:  Marital status: Divorced  Occupational History  Occupation: Retired  Tobacco Use  Smoking status:  Never  Passive exposure: Never  Smokeless tobacco: Never  Vaping Use  Vaping status: Never Used  Substance and Sexual Activity  Alcohol use: Not Currently  Drug use: No  Sexual activity: Defer   Social Determinants of Health:   Financial Resource Strain: Low Risk (03/08/2023)  Overall Financial Resource Strain (CARDIA)  Difficulty of Paying Living  Expenses: Not hard at all  Food Insecurity: No Food Insecurity (03/08/2023)  Hunger Vital Sign  Worried About Running Out of Food in the Last Year: Never true  Ran Out of Food in the Last Year: Never true  Transportation Needs: No Transportation Needs (03/08/2023)  PRAPARE - Risk analyst (Medical): No  Lack of Transportation (Non-Medical): No   Review of Systems:  A comprehensive 14 point ROS was performed, reviewed, and the pertinent orthopaedic findings are documented in the HPI.  Physical Exam: Vitals:  03/26/23 1054  BP: 110/68  Weight: 81.2 kg (179 lb)  Height: 172.7 cm (5\' 8" )  PainSc: 0-No pain  PainLoc: Wrist   General/Constitutional: The patient appears to be well-nourished, well-developed, and in no acute distress. Neuro/Psych: Normal mood and affect, oriented to person, place and time. Eyes: Non-icteric. Pupils are equal, round, and reactive to light, and exhibit synchronous movement. ENT: Unremarkable. Lymphatic: No palpable adenopathy. Respiratory: Lungs clear to auscultation, Normal chest excursion, No wheezes, and Non-labored breathing Cardiovascular: Regular rate and rhythm. No murmurs. and No edema, swelling or tenderness, except as noted in detailed exam. Integumentary: No impressive skin lesions present, except as noted in detailed exam. Musculoskeletal: Unremarkable, except as noted in detailed exam.  Left wrist/hand exam: Skin inspection of the left wrist and hand again is notable for a well-healed surgical incision, but otherwise is unremarkable. No swelling, erythema, ecchymosis, abrasions, or other skin abnormalities are identified. She said it is somewhat limited wrist motion as she can tolerate extension to 40 degrees and flexion to 50 degrees, most likely due to underlying degenerative joint disease. 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 She mains grossly neurovascularly intact to all digits, although with some subjectively decreased sensation to light touch to the thumb, index, and long finger tips.  EMG results:  An EMG from August, 2023 is available for review and have been reviewed by myself. By report, the study demonstrates evidence of recurrent "moderate" carpal tunnel syndrome on the left.  Assessment: Carpal tunnel syndrome, left   Plan: The treatment options were discussed with the patient. In addition, patient educational materials were provided regarding the diagnosis and treatment options. Regarding her left wrist and hand symptoms, the patient is quite frustrated by her symptoms and functional limitations, and is ready to consider more aggressive treatment options. Therefore, I have recommended a surgical procedure, specifically a revision open left carpal tunnel release. The procedure was discussed with the patient, as were the potential risks (including bleeding, infection, nerve and/or blood vessel injury, persistent or recurrent pain/paresthesias, weakness of grip, need for further surgery, blood clots, strokes, heart attacks and/or arhythmias, pneumonia, etc.) and benefits. The patient states his/her understanding and wishes to proceed. All of the patient's questions and concerns were answered. She can call any time with further concerns. She will follow up post-surgery, routine.    H&P reviewed and patient re-examined. No changes.

## 2023-03-31 NOTE — Op Note (Signed)
03/31/2023  9:54 AM  Patient:   Kelly Olsen  Pre-Op Diagnosis:   Recurrent left carpal tunnel syndrome.  Post-Op Diagnosis:   Same.  Procedure:   Open left carpal tunnel release.  Surgeon:   Maryagnes Amos, MD  Assistant:   Jacqulyn Liner, PA-S  Anesthesia:   General LMA  Findings:   As above.  Complications:   None  EBL:   1 cc  Fluids:   800 cc crystalloid  TT:   32 minutes at 250 mmHg  Drains:   None  Closure:   4-0 Prolene interrupted sutures  Brief Clinical Note:   The patient is an 82 year old female with a 6+ month history of recurrent pain and paresthesias to the left hand. Her symptoms have progressed despite medications, activity modification, splinting, and even a steroid injection. She is now 36 years status post a prior mini open left carpal tunnel release. An EMG/NCV has confirmed the presence of recurrent carpal tunnel syndrome. The patient presents at this time for an open left carpal tunnel release.   Procedure:   The patient was brought into the operating room and lain in the supine position. After adequate general laryngeal mask anesthesia was achieved, the left hand and upper extremity were prepped with ChloraPrep solution before being draped sterilely. Preoperative antibiotics were administered. A timeout was performed to verify the appropriate surgical site before the limb was exsanguinated with an Esmarch and the tourniquet inflated to 250 mmHg.   Utilizing the previous incision on the volar aspect of the palm, the incision was extended proximally in a zigzag fashion over the wrist flexor crease into the distal forearm. The incision was carried down through the subcutaneous tissues with care taken to identify and protect any neurovascular structures. The distal forearm fascia was penetrated just proximal to the transverse carpal ligament. The soft tissues were released off the superficial and deep surfaces of the distal forearm fascia and this was released  proximally for 3-4 cm under direct visualization. The nerve was visualized directly before a Therapist, nutritional was passed beneath the transverse carpal ligament along the ulnar aspect of the carpal tunnel and used to release any adhesions as well as to remove any adherent synovial tissue. It also was used to protect the underlying nerve as the recurrent scar tissue and residual transverse carpal ligament was transected in line with the incision. Several adhesions along the ulnar side of the nerve were released to allow the nerve to move more freely within the carpal tunnel. Hemostasis was achieved using bipolar electrocautery.  The wound was irrigated thoroughly with sterile saline solution before being closed using 4-0 Prolene interrupted sutures. A total of 10 cc of 0.5% plain Sensorcaine was injected in and around the incision before a sterile bulky dressing was applied to the wound. The patient was placed into a volar wrist splint before being awakened, extubated, and returned to the recovery room in satisfactory condition after tolerating the procedure well.

## 2023-03-31 NOTE — Anesthesia Preprocedure Evaluation (Addendum)
Anesthesia Evaluation  Patient identified by MRN, date of birth, ID band Patient awake    Reviewed: Allergy & Precautions, NPO status , Patient's Chart, lab work & pertinent test results  History of Anesthesia Complications Negative for: history of anesthetic complications  Airway Mallampati: I   Neck ROM: Full    Dental no notable dental hx.    Pulmonary asthma , COPD   Pulmonary exam normal breath sounds clear to auscultation       Cardiovascular hypertension, + dysrhythmias (a fib on warfarin, last dose 03/27/23) + pacemaker (SSS) + Valvular Problems/Murmurs (s/p TVR)  Rhythm:Regular Rate:Normal + Systolic murmurs ECG 02/04/23: Accelerated junctional rhythm; nonspecific ST abnormality  Myocardial perfusion 06/04/21: Indeterminant Lexiscan infusion EKG  Normal myocardial perfusion without evidence of myocardial ischemia   Echo 06/04/21:  NORMAL LEFT VENTRICULAR SYSTOLIC FUNCTION  NORMAL RIGHT VENTRICULAR SYSTOLIC FUNCTION  NO VALVULAR STENOSIS  MILD to MODERATE MR, TR  MILD PR  EF >55%    Neuro/Psych  Neuromuscular disease (neuropathy)    GI/Hepatic ,GERD  ,,  Endo/Other  negative endocrine ROS    Renal/GU Renal disease (nephrolithiasis)     Musculoskeletal  (+) Arthritis ,  Polymyalgia rheumatica   Abdominal   Peds  Hematology negative hematology ROS (+)   Anesthesia Other Findings Cardiology note 03/10/23:  Plan   Sick sinus syndrome s/p PPM 2018: Device interrogation 01/2023 with estimated longevity 9.5 years, some SVT episodes. -Continue regular device checks as scheduled.  Hx severe tricuspid insufficiency s/p repair 2005: Echo 05/2021 with mild TR. Patient denies any new or worsening shortness of breath, lower extremity edema. -Continue with regular echocardiographic surveillance or as symptoms dictate. Plan to repeat ultrasound next year.  Persistent atrial fibrillation: S/p ablation in 2005. HR  controlled today, rhythm regular on auscultation. Patient without recurrent palpitations, dizziness.  -Continue carvedilol 25 mg twice daily for rate control. -Continue warfarin for stroke risk reduction, INR goal 2-3. Patient is scheduled to have INR checked on 8/22 (will be taking prednisone for 1 week starting today).   Chronic diastolic heart failure: Last echo 05/2021 with EF > 55%, mild to moderate MR. Patient remains without worsening shortness of breath or LE edema.  -GDMT: Losartan 50 mg daily, carvedilol 25 mg twice daily, torsemide 20 mg twice daily -Patient was educated on salt and fluid limitation, monitoring weight daily, and watching for symptoms of heart failure including SOB, orthopnea, or swelling.  -Continue with regular echocardiographic surveillance or as symptoms dictate.   Hypertension: BP controlled in clinic today at 120/72.  -Continue current regimen with no changes.  -Recommended daily BP monitoring, patient to contact our office if BP running consistently >140/90.    No orders of the defined types were placed in this encounter.  Return in about 1 year (around 03/09/2024).    Reproductive/Obstetrics                             Anesthesia Physical Anesthesia Plan  ASA: 3  Anesthesia Plan: General   Post-op Pain Management:    Induction: Intravenous  PONV Risk Score and Plan: 3 and Ondansetron, Dexamethasone and Treatment may vary due to age or medical condition  Airway Management Planned: LMA  Additional Equipment:   Intra-op Plan:   Post-operative Plan: Extubation in OR  Informed Consent: I have reviewed the patients History and Physical, chart, labs and discussed the procedure including the risks, benefits and alternatives for the proposed anesthesia with  the patient or authorized representative who has indicated his/her understanding and acceptance.     Dental advisory given  Plan Discussed with: CRNA  Anesthesia  Plan Comments: (Patient consented for risks of anesthesia including but not limited to:  - adverse reactions to medications - damage to eyes, teeth, lips or other oral mucosa - nerve damage due to positioning  - sore throat or hoarseness - damage to heart, brain, nerves, lungs, other parts of body or loss of life  Informed patient about role of CRNA in peri- and intra-operative care.  Patient voiced understanding.)        Anesthesia Quick Evaluation

## 2023-03-31 NOTE — Anesthesia Postprocedure Evaluation (Signed)
Anesthesia Post Note  Patient: Kelly Olsen  Procedure(s) Performed: OPEN CARPAL TUNNEL RELEASE (Left: Wrist)  Patient location during evaluation: PACU Anesthesia Type: General Level of consciousness: awake and alert, oriented and patient cooperative Pain management: pain level controlled Vital Signs Assessment: post-procedure vital signs reviewed and stable Respiratory status: spontaneous breathing, nonlabored ventilation and respiratory function stable Cardiovascular status: blood pressure returned to baseline and stable Postop Assessment: adequate PO intake Anesthetic complications: no   No notable events documented.   Last Vitals:  Vitals:   03/31/23 1015 03/31/23 1027  BP: 139/65 137/69  Pulse: (!) 58 60  Resp: (!) 23 18  Temp: (!) 36.1 C (!) 36.3 C  SpO2: 94% 96%    Last Pain:  Vitals:   03/31/23 1027  TempSrc: Temporal  PainSc: 0-No pain                 Reed Breech

## 2023-03-31 NOTE — Transfer of Care (Signed)
Immediate Anesthesia Transfer of Care Note  Patient: Kelly Olsen  Procedure(s) Performed: OPEN CARPAL TUNNEL RELEASE (Left: Wrist)  Patient Location: PACU  Anesthesia Type:General  Level of Consciousness: drowsy  Airway & Oxygen Therapy: Patient Spontanous Breathing and Patient connected to face mask oxygen  Post-op Assessment: Report given to RN and Post -op Vital signs reviewed and stable  Post vital signs: stable  Last Vitals:  Vitals Value Taken Time  BP 111/57 03/31/23 0939  Temp    Pulse 60 03/31/23 0941  Resp 16 03/31/23 0941  SpO2 97 % 03/31/23 0941  Vitals shown include unfiled device data.  Last Pain:  Vitals:   03/31/23 0715  PainSc: 0-No pain         Complications: No notable events documented.

## 2023-03-31 NOTE — Discharge Instructions (Addendum)
Orthopedic discharge instructions: Keep dressing dry and intact. Keep hand elevated above heart level. May shower after dressing removed on postop day 4 (Sunday). Cover sutures with Band-Aid or reapply Ace wrap after drying off, then reapply Velcro splint. Apply ice to affected area frequently. Take ES Tylenol when needed.  May resume Coumadin tonight as prescribed (3 mg). Return for follow-up in 10-14 days or as scheduled.   AMBULATORY SURGERY  DISCHARGE INSTRUCTIONS   The drugs that you were given will stay in your system until tomorrow so for the next 24 hours you should not:  Drive an automobile Make any legal decisions Drink any alcoholic beverage   You may resume regular meals tomorrow.  Today it is better to start with liquids and gradually work up to solid foods.  You may eat anything you prefer, but it is better to start with liquids, then soup and crackers, and gradually work up to solid foods.   Please notify your doctor immediately if you have any unusual bleeding, trouble breathing, redness and pain at the surgery site, drainage, fever, or pain not relieved by medication.   Additional Instructions:

## 2023-04-14 ENCOUNTER — Other Ambulatory Visit: Payer: Self-pay | Admitting: *Deleted

## 2023-04-14 DIAGNOSIS — N200 Calculus of kidney: Secondary | ICD-10-CM

## 2023-04-15 ENCOUNTER — Ambulatory Visit
Admission: RE | Admit: 2023-04-15 | Discharge: 2023-04-15 | Disposition: A | Discharge status: Home / Self Care | Payer: Medicare Other | Attending: Urology | Admitting: Urology

## 2023-04-15 ENCOUNTER — Ambulatory Visit
Admission: RE | Admit: 2023-04-15 | Discharge: 2023-04-15 | Disposition: A | Discharge status: Home / Self Care | Payer: Medicare Other | Source: Ambulatory Visit | Attending: Urology | Admitting: Urology

## 2023-04-15 ENCOUNTER — Encounter: Payer: Self-pay | Admitting: Urology

## 2023-04-15 ENCOUNTER — Ambulatory Visit: Payer: Medicare Other | Admitting: Urology

## 2023-04-15 ENCOUNTER — Ambulatory Visit (INDEPENDENT_AMBULATORY_CARE_PROVIDER_SITE_OTHER): Payer: Medicare Other | Admitting: Urology

## 2023-04-15 VITALS — BP 128/70 | HR 78 | Ht 66.0 in | Wt 177.0 lb

## 2023-04-15 DIAGNOSIS — N200 Calculus of kidney: Secondary | ICD-10-CM

## 2023-04-15 NOTE — Progress Notes (Signed)
I,Amy L Pierron,acting as a scribe for Riki Altes, MD.,have documented all relevant documentation on the behalf of Riki Altes, MD,as directed by  Riki Altes, MD while in the presence of Riki Altes, MD.  04/15/2023 2:19 PM   Kelly Olsen [birth year] [id number]  Referring provider: Wilford Corner, PA-C [address] [place],  [place] [address]  Chief Complaint  Patient presents with   Nephrolithiasis   Urologic History:  1. Nephrolithiasis CT abdomen pelvis 04/10/2022 with a 7 mm right lower pole calculus and 4 mm left lower pole calculus.  HPI: 82 y.o. female presents today for annual follow-up.  No problems since last year's visit. No bothersome LUTS. Denies flank, abdominal, or pelvic pain. Denies gross hematuria. KUB today shows a calcification overlying the superior portion of the right renal shadow measuring 6 millimeters and a calcification overlying the inferior pole of the left renal shadow measuring 4 millimeters. No other abnormal appearing calcifications are identified.   PMH: Past Medical History:  Diagnosis Date   Aortic atherosclerosis (HCC)    Arthritis    Atrial fibrillation (HCC)    a.) CHA2DS2VASc = 5 (age x2, sex, HTN, vascular disease history);  b.) s/p cardiac ablations x 2 (2004 and 2006) - records unavailable; c.) rate/rhythm maintained using carvedilol; chronically anticoagulated with warfarin   Bilateral carpal tunnel syndrome    a.) s/p BILATERAL release   COPD (chronic obstructive pulmonary disease) (HCC)    Eosinophilic asthma    a.) on biologic/mAB therapy (dupilumab)   GERD (gastroesophageal reflux disease)    History of 2019 novel coronavirus disease (COVID-19) 04/2022   History of bilateral cataract extraction    History of kidney stones    Hypertension    Long term current use of anticoagulant    a.) warfarin   Mild cardiomegaly    Neuropathy of both feet    Presence of permanent cardiac pacemaker 2009    a.) s/p placement of [employer] EL [id number] in 2009 ([place]); b.) battery changed in 2018 ([place])   SA node dysfunction [facility])    a.) s/p dual chamber PPM placement 2009   Severe tricuspid valve insufficiency    a.) s/p TV repair via median sternotomy in ~2005/2006 done in [place]    Surgical History: Past Surgical History:  Procedure Laterality Date   BILATERAL CARPAL TUNNEL RELEASE Bilateral    CARDIAC ELECTROPHYSIOLOGY STUDY AND ABLATION N/A 2004   CARDIAC ELECTROPHYSIOLOGY STUDY AND ABLATION N/A 2006   CARDIAC VALVE SURGERY N/A    Procedure: TRICUSPID VALVE REPAIR; Location: [place]   CARPAL TUNNEL RELEASE Right 02/10/2023   Procedure: REVISION OPEN CARPAL TUNNEL RELEASE;  Surgeon: Christena Flake, MD;  Location: [facility];  Service: Orthopedics;  Laterality: Right;  2nd case   CARPAL TUNNEL RELEASE Left 03/31/2023   Procedure: OPEN CARPAL TUNNEL RELEASE;  Surgeon: Christena Flake, MD;  Location: [facility];  Service: Orthopedics;  Laterality: Left;  2nd case   CATARACT EXTRACTION, BILATERAL     CHOLECYSTECTOMY     PACEMAKER BATTERY CHANGE N/A 2018   Procedure: PACEMAKER BATTERY CHANGE; Location: [place]   PACEMAKER INSERTION N/A 2009   Procedure: PACEMAKER INSERTION; Location: [place]   TOTAL HIP ARTHROPLASTY Bilateral    TOTAL KNEE ARTHROPLASTY Right     Home Medications:  Allergies as of 04/15/2023       Reactions   Tramadol    Other reaction(s): Other (See Comments) Causes extreme sleepiness, "wooziness"   Butalbital-apap-caff-cod  Other Reaction(s): Not available   Butalbital-apap-caffeine    Other Reaction(s): Other (See Comments)   Codeine Nausea Only   Amlodipine Rash        Medication List        Accurate as of April 15, 2023  2:19 PM. If you have any questions, ask your nurse or doctor.          acetaminophen 650 MG CR tablet Commonly known as: TYLENOL Take 650 mg by mouth every 8 (eight) hours as needed for pain.    albuterol (2.5 MG/3ML) 0.083% nebulizer solution Commonly known as: PROVENTIL Take 2.5 mg by nebulization every evening.   budesonide 0.5 MG/2ML nebulizer solution Commonly known as: PULMICORT Take 0.5 mg by nebulization 2 (two) times daily.   Calcium Carb-Cholecalciferol 1000-800 MG-UNIT Tabs Take 2 tablets by mouth daily.   carvedilol 25 MG tablet Commonly known as: COREG Take 1 tablet by mouth 2 (two) times daily.   DULoxetine 60 MG capsule Commonly known as: CYMBALTA Take 60 mg by mouth daily.   Dupixent 300 MG/2ML Sopn Generic drug: Dupilumab Inject 300 mg into the skin every 14 (fourteen) days.   EPINEPHrine 0.3 mg/0.3 mL Soaj injection Commonly known as: EPI-PEN Inject 0.3 mg into the muscle as needed for anaphylaxis.   gabapentin 600 MG tablet Commonly known as: NEURONTIN Take 600 mg by mouth at bedtime.   Ipratropium-Albuterol 20-100 MCG/ACT Aers respimat Commonly known as: COMBIVENT Inhale 2 puffs into the lungs every 6 (six) hours as needed.   losartan 50 MG tablet Commonly known as: COZAAR Take 1 tablet by mouth every morning.   montelukast 10 MG tablet Commonly known as: SINGULAIR Take 10 mg by mouth at bedtime.   multivitamin capsule Take 1 capsule by mouth daily.   omeprazole 40 MG capsule Commonly known as: PRILOSEC Take 40 mg by mouth daily. What changed: Another medication with the same name was removed. Continue taking this medication, and follow the directions you see here. Changed by: Riki Altes   Potassium 99 MG Tabs Take 1 tablet by mouth 2 (two) times daily.   torsemide 20 MG tablet Commonly known as: DEMADEX Take 20 mg by mouth 2 (two) times daily.   warfarin 3 MG tablet Commonly known as: COUMADIN Take 1 tablet by mouth daily. Take as directed (3 mg  on Wed and Fri then 1.5 mg every other day)        Allergies:  Allergies  Allergen Reactions   Tramadol     Other reaction(s): Other (See Comments) Causes extreme  sleepiness, "wooziness"   Butalbital-Apap-Caff-Cod     Other Reaction(s): Not available   Butalbital-Apap-Caffeine     Other Reaction(s): Other (See Comments)   Codeine Nausea Only   Amlodipine Rash    Family History: No family history on file.  Social History:  reports that she has never smoked. She has never used smokeless tobacco. She reports that she does not drink alcohol and does not use drugs.   Physical Exam: BP 128/70   Pulse 78   Ht 5\' 6"  (1.676 m)   Wt 177 lb (80.3 kg)   BMI 28.57 kg/m   Constitutional:  Alert and oriented, No acute distress. HEENT: [place] AT, moist mucus membranes.  Trachea midline, no masses. Cardiovascular: No clubbing, cyanosis, or edema. Respiratory: Normal respiratory effort, no increased work of breathing. Psychiatric: Normal mood and affect.   Pertinent Imaging: KUB was obtained today and personally reviewed and interpreted.  Not yet interpreted by  radiology  Assessment & Plan:    1.  Bilateral Nephrolithiasis Non obstructing renal calculi. She desires to continue surveillance and will follow up in 1 year with a KUB. Instructed to call earlier for development of kidney symptoms/ renal colic.   I have reviewed the above documentation for accuracy and completeness, and I agree with the above.   Riki Altes, MD  [facility] [address] [place], [place] [address] [phone]

## 2023-04-18 ENCOUNTER — Encounter: Payer: Self-pay | Admitting: Urology

## 2023-05-17 IMAGING — CR DG ABDOMEN 1V
2 series · 2 of 2 positions shown · non-contrast
Comparison: None.

CLINICAL DATA: Kidney stone

EXAM:
ABDOMEN - 1 VIEW

[abdomen kub (1 of 2)]
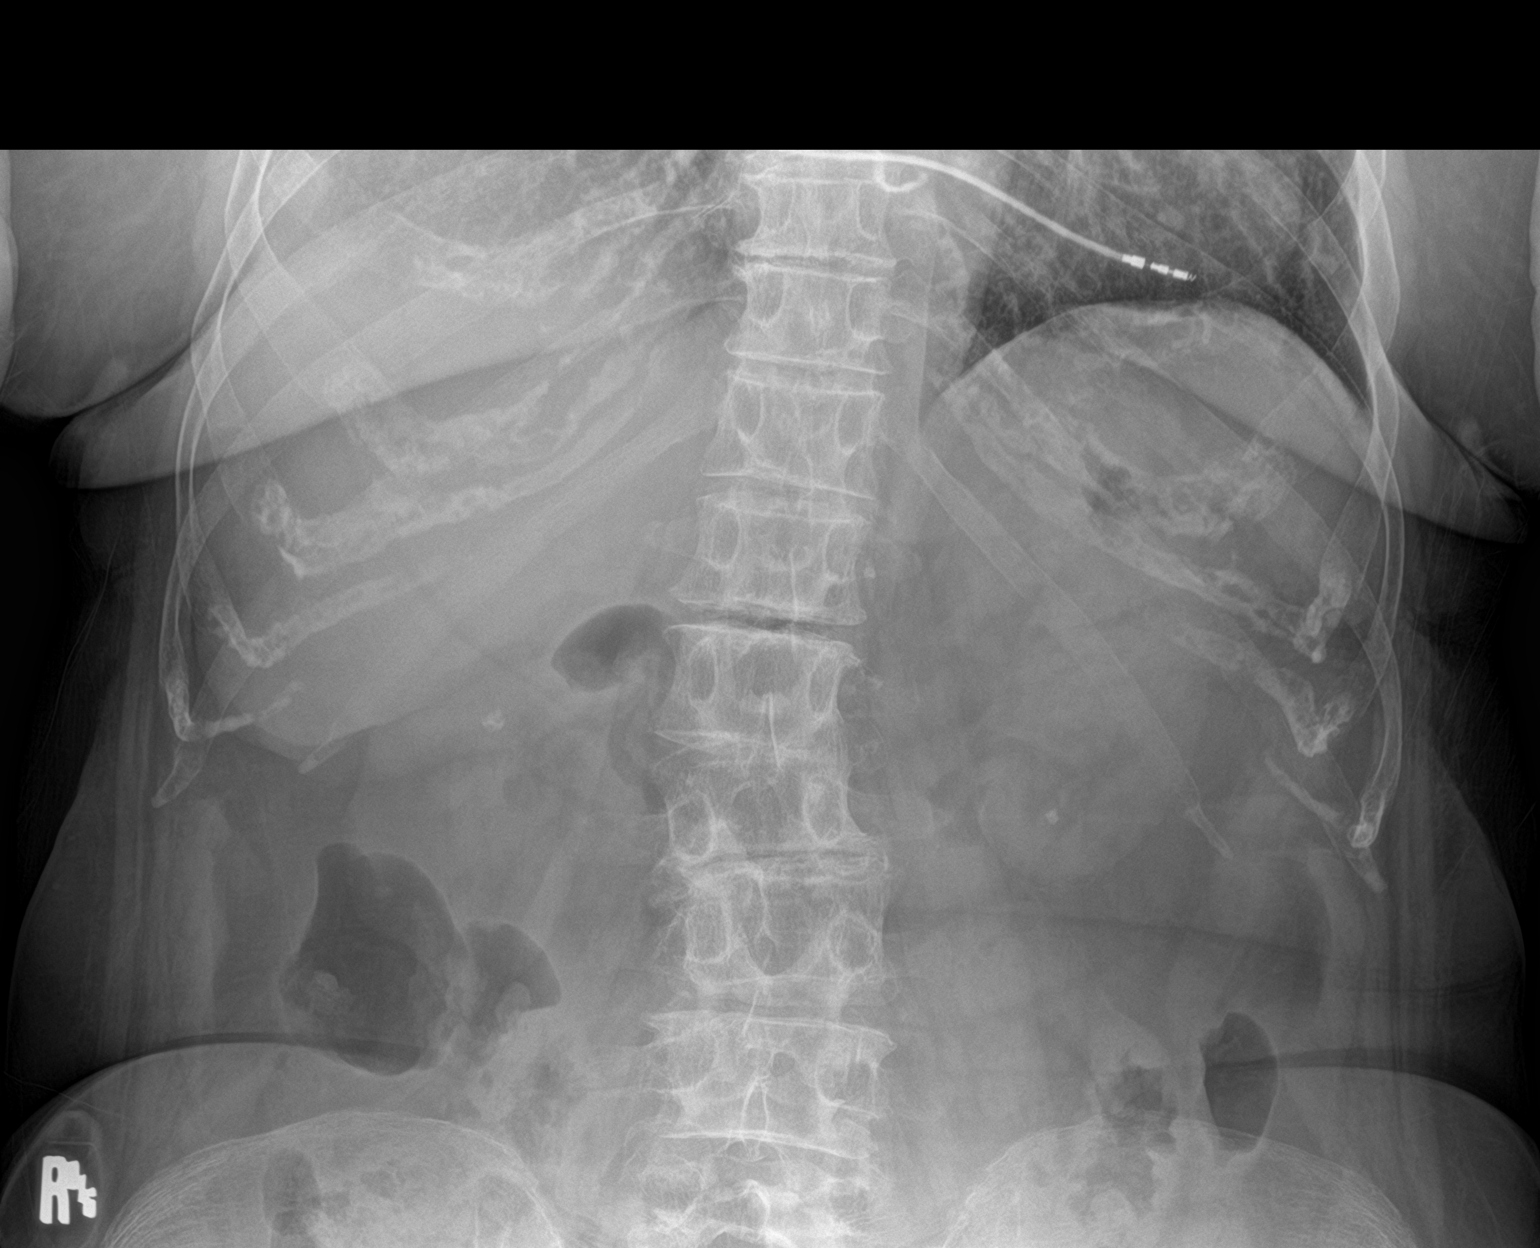

[abdomen kub (2 of 2)]
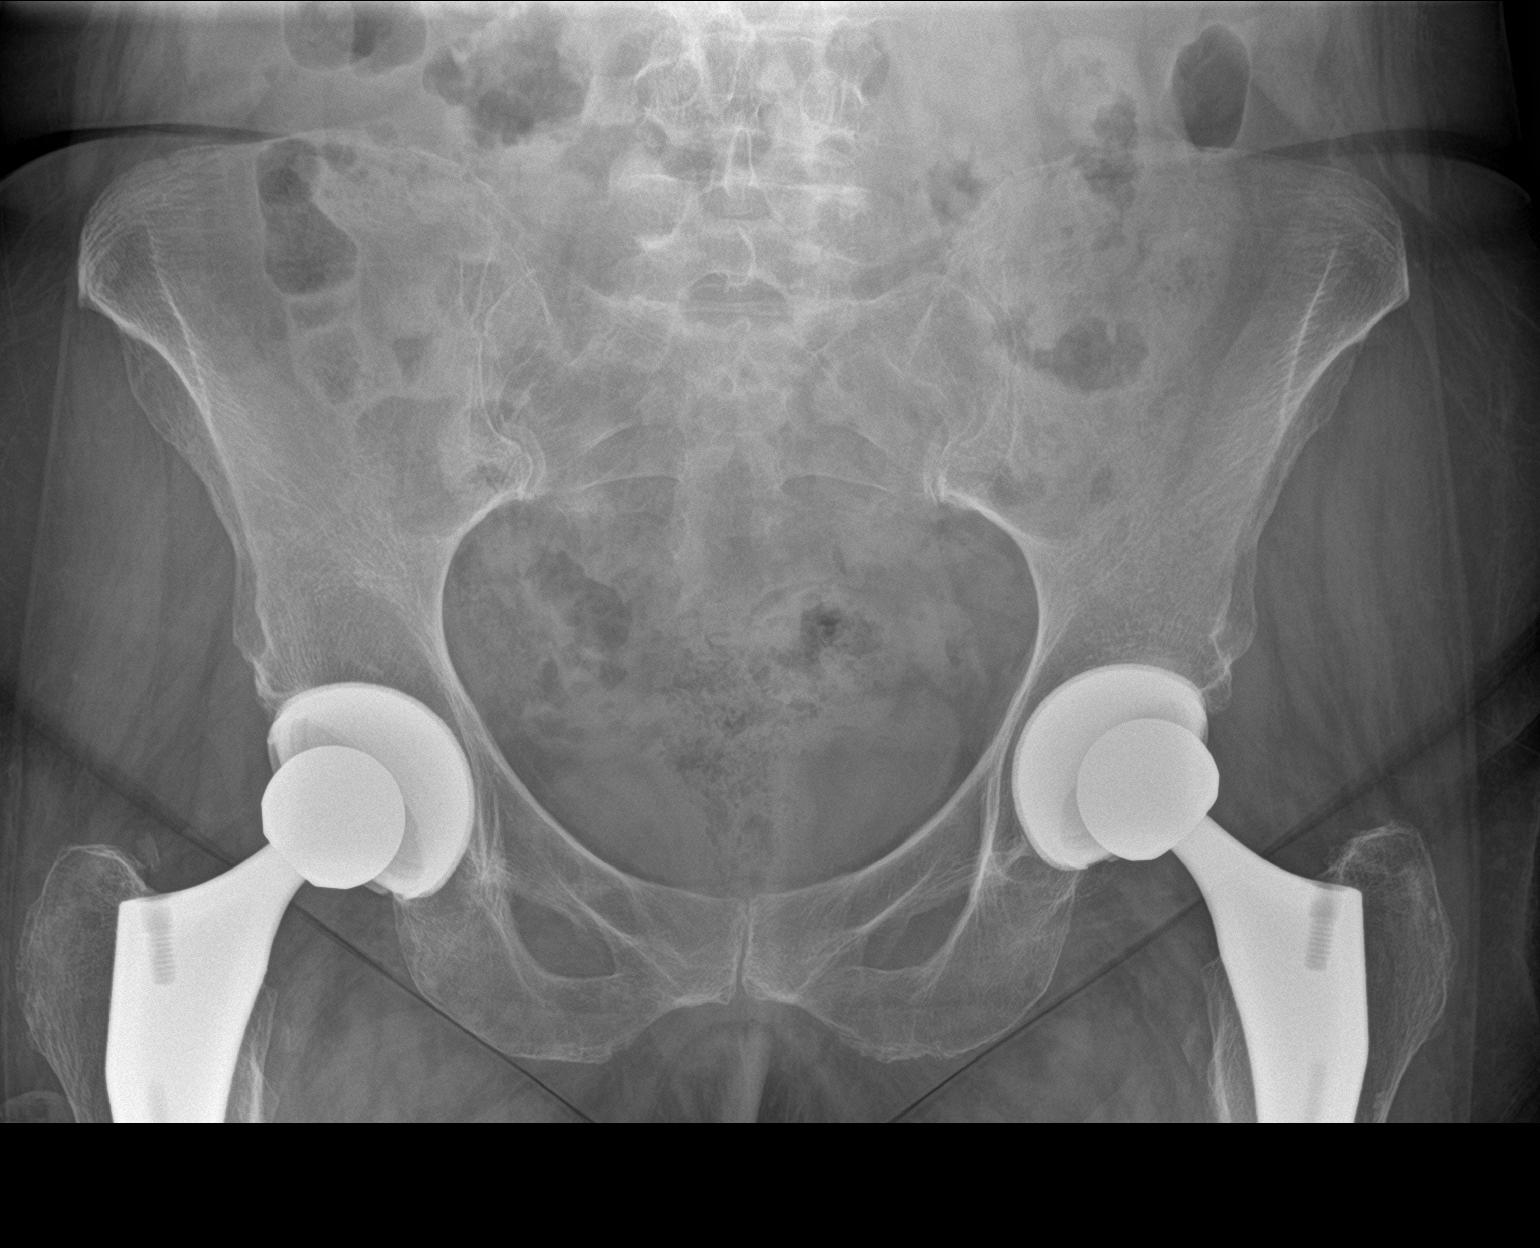

[2 of 2 positions shown; findings below may reference images not displayed]

FINDINGS: Calcifications are seen projecting over both renal shadows likely
reflecting renal stones measuring up to 5 mm on the right and 4 mm
on the left. No definite ureteral stones are seen.

There is a nonobstructive bowel gas pattern. There is no definite
free intraperitoneal air. There is asymmetric elevation of the right
hemidiaphragm. Median sternotomy wires and a cardiac device leads
are partially imaged.

There is no acute osseous abnormality. There is multilevel
degenerative change of the lumbar spine. Bilateral hip arthroplasty
hardware is noted.
IMPRESSION: Bilateral renal stones measuring up to 5 mm on the right and 4 mm on
the left.

## 2023-08-03 DIAGNOSIS — I4819 Other persistent atrial fibrillation: Secondary | ICD-10-CM | POA: Diagnosis not present

## 2023-08-03 DIAGNOSIS — Z950 Presence of cardiac pacemaker: Secondary | ICD-10-CM | POA: Diagnosis not present

## 2023-08-03 DIAGNOSIS — I495 Sick sinus syndrome: Secondary | ICD-10-CM | POA: Diagnosis not present

## 2023-08-11 DIAGNOSIS — H353131 Nonexudative age-related macular degeneration, bilateral, early dry stage: Secondary | ICD-10-CM | POA: Diagnosis not present

## 2023-08-11 DIAGNOSIS — H0288A Meibomian gland dysfunction right eye, upper and lower eyelids: Secondary | ICD-10-CM | POA: Diagnosis not present

## 2023-08-11 DIAGNOSIS — H0288B Meibomian gland dysfunction left eye, upper and lower eyelids: Secondary | ICD-10-CM | POA: Diagnosis not present

## 2023-08-11 DIAGNOSIS — H16223 Keratoconjunctivitis sicca, not specified as Sjogren's, bilateral: Secondary | ICD-10-CM | POA: Diagnosis not present

## 2023-08-11 DIAGNOSIS — Z961 Presence of intraocular lens: Secondary | ICD-10-CM | POA: Diagnosis not present

## 2023-08-11 DIAGNOSIS — M3500 Sicca syndrome, unspecified: Secondary | ICD-10-CM | POA: Diagnosis not present

## 2023-08-12 DIAGNOSIS — M150 Primary generalized (osteo)arthritis: Secondary | ICD-10-CM | POA: Diagnosis not present

## 2023-08-12 DIAGNOSIS — Z7901 Long term (current) use of anticoagulants: Secondary | ICD-10-CM | POA: Diagnosis not present

## 2023-08-12 DIAGNOSIS — Z7952 Long term (current) use of systemic steroids: Secondary | ICD-10-CM | POA: Insufficient documentation

## 2023-08-12 DIAGNOSIS — M810 Age-related osteoporosis without current pathological fracture: Secondary | ICD-10-CM | POA: Diagnosis not present

## 2023-08-12 DIAGNOSIS — M353 Polymyalgia rheumatica: Secondary | ICD-10-CM | POA: Diagnosis not present

## 2023-08-12 DIAGNOSIS — I4819 Other persistent atrial fibrillation: Secondary | ICD-10-CM | POA: Diagnosis not present

## 2023-08-24 DIAGNOSIS — I4819 Other persistent atrial fibrillation: Secondary | ICD-10-CM | POA: Diagnosis not present

## 2023-08-24 DIAGNOSIS — Z7901 Long term (current) use of anticoagulants: Secondary | ICD-10-CM | POA: Diagnosis not present

## 2023-08-26 DIAGNOSIS — J449 Chronic obstructive pulmonary disease, unspecified: Secondary | ICD-10-CM | POA: Diagnosis not present

## 2023-08-26 DIAGNOSIS — J4540 Moderate persistent asthma, uncomplicated: Secondary | ICD-10-CM | POA: Diagnosis not present

## 2023-09-07 DIAGNOSIS — H16223 Keratoconjunctivitis sicca, not specified as Sjogren's, bilateral: Secondary | ICD-10-CM | POA: Diagnosis not present

## 2023-09-07 DIAGNOSIS — M353 Polymyalgia rheumatica: Secondary | ICD-10-CM | POA: Diagnosis not present

## 2023-09-07 DIAGNOSIS — H0288A Meibomian gland dysfunction right eye, upper and lower eyelids: Secondary | ICD-10-CM | POA: Diagnosis not present

## 2023-09-07 DIAGNOSIS — H0288B Meibomian gland dysfunction left eye, upper and lower eyelids: Secondary | ICD-10-CM | POA: Diagnosis not present

## 2023-09-09 DIAGNOSIS — I4819 Other persistent atrial fibrillation: Secondary | ICD-10-CM | POA: Diagnosis not present

## 2023-09-09 DIAGNOSIS — K219 Gastro-esophageal reflux disease without esophagitis: Secondary | ICD-10-CM | POA: Diagnosis not present

## 2023-09-09 DIAGNOSIS — E876 Hypokalemia: Secondary | ICD-10-CM | POA: Diagnosis not present

## 2023-09-09 DIAGNOSIS — J449 Chronic obstructive pulmonary disease, unspecified: Secondary | ICD-10-CM | POA: Diagnosis not present

## 2023-09-09 DIAGNOSIS — I5032 Chronic diastolic (congestive) heart failure: Secondary | ICD-10-CM | POA: Diagnosis not present

## 2023-09-09 DIAGNOSIS — I110 Hypertensive heart disease with heart failure: Secondary | ICD-10-CM | POA: Diagnosis not present

## 2023-09-09 DIAGNOSIS — M353 Polymyalgia rheumatica: Secondary | ICD-10-CM | POA: Diagnosis not present

## 2023-09-09 DIAGNOSIS — Z0000 Encounter for general adult medical examination without abnormal findings: Secondary | ICD-10-CM | POA: Diagnosis not present

## 2023-09-23 DIAGNOSIS — Z7952 Long term (current) use of systemic steroids: Secondary | ICD-10-CM | POA: Diagnosis not present

## 2023-09-23 DIAGNOSIS — M353 Polymyalgia rheumatica: Secondary | ICD-10-CM | POA: Diagnosis not present

## 2023-09-23 DIAGNOSIS — Z7901 Long term (current) use of anticoagulants: Secondary | ICD-10-CM | POA: Diagnosis not present

## 2023-09-23 DIAGNOSIS — I4819 Other persistent atrial fibrillation: Secondary | ICD-10-CM | POA: Diagnosis not present

## 2023-09-23 DIAGNOSIS — M810 Age-related osteoporosis without current pathological fracture: Secondary | ICD-10-CM | POA: Diagnosis not present

## 2023-09-23 DIAGNOSIS — M150 Primary generalized (osteo)arthritis: Secondary | ICD-10-CM | POA: Diagnosis not present

## 2023-10-20 DIAGNOSIS — I4819 Other persistent atrial fibrillation: Secondary | ICD-10-CM | POA: Diagnosis not present

## 2023-10-20 DIAGNOSIS — Z7901 Long term (current) use of anticoagulants: Secondary | ICD-10-CM | POA: Diagnosis not present

## 2023-11-03 DIAGNOSIS — J4540 Moderate persistent asthma, uncomplicated: Secondary | ICD-10-CM | POA: Diagnosis not present

## 2023-11-03 DIAGNOSIS — J449 Chronic obstructive pulmonary disease, unspecified: Secondary | ICD-10-CM | POA: Diagnosis not present

## 2023-11-17 DIAGNOSIS — I4819 Other persistent atrial fibrillation: Secondary | ICD-10-CM | POA: Diagnosis not present

## 2023-11-17 DIAGNOSIS — Z7901 Long term (current) use of anticoagulants: Secondary | ICD-10-CM | POA: Diagnosis not present

## 2023-11-23 DIAGNOSIS — Z7952 Long term (current) use of systemic steroids: Secondary | ICD-10-CM | POA: Diagnosis not present

## 2023-11-23 DIAGNOSIS — M353 Polymyalgia rheumatica: Secondary | ICD-10-CM | POA: Diagnosis not present

## 2023-11-23 DIAGNOSIS — M810 Age-related osteoporosis without current pathological fracture: Secondary | ICD-10-CM | POA: Diagnosis not present

## 2023-11-23 DIAGNOSIS — M150 Primary generalized (osteo)arthritis: Secondary | ICD-10-CM | POA: Diagnosis not present

## 2023-11-23 DIAGNOSIS — M5136 Other intervertebral disc degeneration, lumbar region with discogenic back pain only: Secondary | ICD-10-CM | POA: Diagnosis not present

## 2023-12-02 ENCOUNTER — Other Ambulatory Visit: Payer: Self-pay | Admitting: Family Medicine

## 2023-12-02 DIAGNOSIS — M5416 Radiculopathy, lumbar region: Secondary | ICD-10-CM | POA: Diagnosis not present

## 2023-12-02 DIAGNOSIS — M47816 Spondylosis without myelopathy or radiculopathy, lumbar region: Secondary | ICD-10-CM | POA: Diagnosis not present

## 2023-12-08 ENCOUNTER — Ambulatory Visit
Admission: RE | Admit: 2023-12-08 | Discharge: 2023-12-08 | Disposition: A | Discharge status: Home / Self Care | Source: Ambulatory Visit | Attending: Family Medicine | Admitting: Family Medicine

## 2023-12-08 DIAGNOSIS — M5416 Radiculopathy, lumbar region: Secondary | ICD-10-CM

## 2023-12-08 DIAGNOSIS — M5126 Other intervertebral disc displacement, lumbar region: Secondary | ICD-10-CM | POA: Diagnosis not present

## 2023-12-08 DIAGNOSIS — M48061 Spinal stenosis, lumbar region without neurogenic claudication: Secondary | ICD-10-CM | POA: Diagnosis not present

## 2023-12-08 DIAGNOSIS — M4726 Other spondylosis with radiculopathy, lumbar region: Secondary | ICD-10-CM | POA: Diagnosis not present

## 2023-12-14 DIAGNOSIS — I4819 Other persistent atrial fibrillation: Secondary | ICD-10-CM | POA: Diagnosis not present

## 2023-12-14 DIAGNOSIS — Z7901 Long term (current) use of anticoagulants: Secondary | ICD-10-CM | POA: Diagnosis not present

## 2023-12-27 DIAGNOSIS — M5416 Radiculopathy, lumbar region: Secondary | ICD-10-CM | POA: Diagnosis not present

## 2023-12-27 DIAGNOSIS — M47816 Spondylosis without myelopathy or radiculopathy, lumbar region: Secondary | ICD-10-CM | POA: Diagnosis not present

## 2023-12-27 DIAGNOSIS — Z7901 Long term (current) use of anticoagulants: Secondary | ICD-10-CM | POA: Diagnosis not present

## 2023-12-27 DIAGNOSIS — M48062 Spinal stenosis, lumbar region with neurogenic claudication: Secondary | ICD-10-CM | POA: Diagnosis not present

## 2023-12-28 DIAGNOSIS — G8929 Other chronic pain: Secondary | ICD-10-CM | POA: Diagnosis not present

## 2023-12-28 DIAGNOSIS — M545 Low back pain, unspecified: Secondary | ICD-10-CM | POA: Diagnosis not present

## 2023-12-28 DIAGNOSIS — Z79899 Other long term (current) drug therapy: Secondary | ICD-10-CM | POA: Diagnosis not present

## 2023-12-28 DIAGNOSIS — J8283 Eosinophilic asthma: Secondary | ICD-10-CM | POA: Diagnosis not present

## 2023-12-28 DIAGNOSIS — T7840XA Allergy, unspecified, initial encounter: Secondary | ICD-10-CM | POA: Diagnosis not present

## 2024-01-04 DIAGNOSIS — H353131 Nonexudative age-related macular degeneration, bilateral, early dry stage: Secondary | ICD-10-CM | POA: Diagnosis not present

## 2024-01-04 DIAGNOSIS — M353 Polymyalgia rheumatica: Secondary | ICD-10-CM | POA: Diagnosis not present

## 2024-01-04 DIAGNOSIS — H0288B Meibomian gland dysfunction left eye, upper and lower eyelids: Secondary | ICD-10-CM | POA: Diagnosis not present

## 2024-01-04 DIAGNOSIS — Z961 Presence of intraocular lens: Secondary | ICD-10-CM | POA: Diagnosis not present

## 2024-01-04 DIAGNOSIS — H0288A Meibomian gland dysfunction right eye, upper and lower eyelids: Secondary | ICD-10-CM | POA: Diagnosis not present

## 2024-01-04 DIAGNOSIS — H16223 Keratoconjunctivitis sicca, not specified as Sjogren's, bilateral: Secondary | ICD-10-CM | POA: Diagnosis not present

## 2024-01-06 DIAGNOSIS — Z7901 Long term (current) use of anticoagulants: Secondary | ICD-10-CM | POA: Diagnosis not present

## 2024-01-07 DIAGNOSIS — M5416 Radiculopathy, lumbar region: Secondary | ICD-10-CM | POA: Diagnosis not present

## 2024-01-07 DIAGNOSIS — M48062 Spinal stenosis, lumbar region with neurogenic claudication: Secondary | ICD-10-CM | POA: Diagnosis not present

## 2024-01-21 DIAGNOSIS — J4540 Moderate persistent asthma, uncomplicated: Secondary | ICD-10-CM | POA: Diagnosis not present

## 2024-01-31 DIAGNOSIS — M5489 Other dorsalgia: Secondary | ICD-10-CM | POA: Diagnosis not present

## 2024-01-31 DIAGNOSIS — M5416 Radiculopathy, lumbar region: Secondary | ICD-10-CM | POA: Diagnosis not present

## 2024-01-31 DIAGNOSIS — M549 Dorsalgia, unspecified: Secondary | ICD-10-CM | POA: Diagnosis not present

## 2024-01-31 DIAGNOSIS — M48062 Spinal stenosis, lumbar region with neurogenic claudication: Secondary | ICD-10-CM | POA: Diagnosis not present

## 2024-02-02 DIAGNOSIS — Z950 Presence of cardiac pacemaker: Secondary | ICD-10-CM | POA: Diagnosis not present

## 2024-02-02 DIAGNOSIS — I4819 Other persistent atrial fibrillation: Secondary | ICD-10-CM | POA: Diagnosis not present

## 2024-02-02 DIAGNOSIS — Z7901 Long term (current) use of anticoagulants: Secondary | ICD-10-CM | POA: Diagnosis not present

## 2024-02-08 DIAGNOSIS — M5416 Radiculopathy, lumbar region: Secondary | ICD-10-CM | POA: Diagnosis not present

## 2024-02-08 DIAGNOSIS — M6281 Muscle weakness (generalized): Secondary | ICD-10-CM | POA: Diagnosis not present

## 2024-02-08 DIAGNOSIS — M48062 Spinal stenosis, lumbar region with neurogenic claudication: Secondary | ICD-10-CM | POA: Diagnosis not present

## 2024-02-10 DIAGNOSIS — M5416 Radiculopathy, lumbar region: Secondary | ICD-10-CM | POA: Diagnosis not present

## 2024-02-10 DIAGNOSIS — M6281 Muscle weakness (generalized): Secondary | ICD-10-CM | POA: Diagnosis not present

## 2024-02-10 DIAGNOSIS — M48062 Spinal stenosis, lumbar region with neurogenic claudication: Secondary | ICD-10-CM | POA: Diagnosis not present

## 2024-02-17 DIAGNOSIS — M48062 Spinal stenosis, lumbar region with neurogenic claudication: Secondary | ICD-10-CM | POA: Diagnosis not present

## 2024-02-17 DIAGNOSIS — I4819 Other persistent atrial fibrillation: Secondary | ICD-10-CM | POA: Diagnosis not present

## 2024-02-17 DIAGNOSIS — M5416 Radiculopathy, lumbar region: Secondary | ICD-10-CM | POA: Diagnosis not present

## 2024-02-17 DIAGNOSIS — Z7901 Long term (current) use of anticoagulants: Secondary | ICD-10-CM | POA: Diagnosis not present

## 2024-02-17 DIAGNOSIS — M6281 Muscle weakness (generalized): Secondary | ICD-10-CM | POA: Diagnosis not present

## 2024-02-21 DIAGNOSIS — M1712 Unilateral primary osteoarthritis, left knee: Secondary | ICD-10-CM | POA: Diagnosis not present

## 2024-02-24 DIAGNOSIS — M5416 Radiculopathy, lumbar region: Secondary | ICD-10-CM | POA: Diagnosis not present

## 2024-02-24 DIAGNOSIS — M48062 Spinal stenosis, lumbar region with neurogenic claudication: Secondary | ICD-10-CM | POA: Diagnosis not present

## 2024-02-24 DIAGNOSIS — M6281 Muscle weakness (generalized): Secondary | ICD-10-CM | POA: Diagnosis not present

## 2024-02-29 DIAGNOSIS — M48062 Spinal stenosis, lumbar region with neurogenic claudication: Secondary | ICD-10-CM | POA: Diagnosis not present

## 2024-02-29 DIAGNOSIS — M5416 Radiculopathy, lumbar region: Secondary | ICD-10-CM | POA: Diagnosis not present

## 2024-02-29 DIAGNOSIS — M6281 Muscle weakness (generalized): Secondary | ICD-10-CM | POA: Diagnosis not present

## 2024-03-02 DIAGNOSIS — M6281 Muscle weakness (generalized): Secondary | ICD-10-CM | POA: Diagnosis not present

## 2024-03-02 DIAGNOSIS — M48062 Spinal stenosis, lumbar region with neurogenic claudication: Secondary | ICD-10-CM | POA: Diagnosis not present

## 2024-03-02 DIAGNOSIS — M5416 Radiculopathy, lumbar region: Secondary | ICD-10-CM | POA: Diagnosis not present

## 2024-03-06 DIAGNOSIS — M5416 Radiculopathy, lumbar region: Secondary | ICD-10-CM | POA: Diagnosis not present

## 2024-03-06 DIAGNOSIS — M6281 Muscle weakness (generalized): Secondary | ICD-10-CM | POA: Diagnosis not present

## 2024-03-06 DIAGNOSIS — M48062 Spinal stenosis, lumbar region with neurogenic claudication: Secondary | ICD-10-CM | POA: Diagnosis not present

## 2024-03-07 DIAGNOSIS — Z111 Encounter for screening for respiratory tuberculosis: Secondary | ICD-10-CM | POA: Diagnosis not present

## 2024-03-07 DIAGNOSIS — Z117 Encounter for testing for latent tuberculosis infection: Secondary | ICD-10-CM | POA: Diagnosis not present

## 2024-03-07 DIAGNOSIS — Z7952 Long term (current) use of systemic steroids: Secondary | ICD-10-CM | POA: Diagnosis not present

## 2024-03-07 DIAGNOSIS — M150 Primary generalized (osteo)arthritis: Secondary | ICD-10-CM | POA: Diagnosis not present

## 2024-03-07 DIAGNOSIS — Z7960 Long term (current) use of unspecified immunomodulators and immunosuppressants: Secondary | ICD-10-CM | POA: Diagnosis not present

## 2024-03-07 DIAGNOSIS — M353 Polymyalgia rheumatica: Secondary | ICD-10-CM | POA: Diagnosis not present

## 2024-03-07 DIAGNOSIS — M810 Age-related osteoporosis without current pathological fracture: Secondary | ICD-10-CM | POA: Diagnosis not present

## 2024-03-10 DIAGNOSIS — M48061 Spinal stenosis, lumbar region without neurogenic claudication: Secondary | ICD-10-CM | POA: Diagnosis not present

## 2024-03-10 DIAGNOSIS — Z7901 Long term (current) use of anticoagulants: Secondary | ICD-10-CM | POA: Diagnosis not present

## 2024-03-10 DIAGNOSIS — I4819 Other persistent atrial fibrillation: Secondary | ICD-10-CM | POA: Diagnosis not present

## 2024-03-10 DIAGNOSIS — M353 Polymyalgia rheumatica: Secondary | ICD-10-CM | POA: Diagnosis not present

## 2024-03-10 DIAGNOSIS — M810 Age-related osteoporosis without current pathological fracture: Secondary | ICD-10-CM | POA: Diagnosis not present

## 2024-03-10 DIAGNOSIS — K219 Gastro-esophageal reflux disease without esophagitis: Secondary | ICD-10-CM | POA: Diagnosis not present

## 2024-03-10 DIAGNOSIS — J449 Chronic obstructive pulmonary disease, unspecified: Secondary | ICD-10-CM | POA: Diagnosis not present

## 2024-03-10 DIAGNOSIS — I110 Hypertensive heart disease with heart failure: Secondary | ICD-10-CM | POA: Diagnosis not present

## 2024-03-10 DIAGNOSIS — I5032 Chronic diastolic (congestive) heart failure: Secondary | ICD-10-CM | POA: Diagnosis not present

## 2024-03-16 DIAGNOSIS — I495 Sick sinus syndrome: Secondary | ICD-10-CM | POA: Diagnosis not present

## 2024-03-16 DIAGNOSIS — I4819 Other persistent atrial fibrillation: Secondary | ICD-10-CM | POA: Diagnosis not present

## 2024-03-16 DIAGNOSIS — I071 Rheumatic tricuspid insufficiency: Secondary | ICD-10-CM | POA: Diagnosis not present

## 2024-03-16 DIAGNOSIS — Z9889 Other specified postprocedural states: Secondary | ICD-10-CM | POA: Diagnosis not present

## 2024-03-16 DIAGNOSIS — Z950 Presence of cardiac pacemaker: Secondary | ICD-10-CM | POA: Diagnosis not present

## 2024-03-29 DIAGNOSIS — I071 Rheumatic tricuspid insufficiency: Secondary | ICD-10-CM | POA: Diagnosis not present

## 2024-03-29 DIAGNOSIS — I4819 Other persistent atrial fibrillation: Secondary | ICD-10-CM | POA: Diagnosis not present

## 2024-04-07 ENCOUNTER — Other Ambulatory Visit: Payer: Self-pay

## 2024-04-07 ENCOUNTER — Emergency Department

## 2024-04-07 ENCOUNTER — Encounter: Payer: Self-pay | Admitting: Medical Oncology

## 2024-04-07 ENCOUNTER — Emergency Department
Admission: EM | Admit: 2024-04-07 | Discharge: 2024-04-07 | Disposition: A | Discharge status: Home / Self Care | Attending: Emergency Medicine | Admitting: Emergency Medicine

## 2024-04-07 DIAGNOSIS — W1812XA Fall from or off toilet with subsequent striking against object, initial encounter: Secondary | ICD-10-CM | POA: Diagnosis not present

## 2024-04-07 DIAGNOSIS — S0990XA Unspecified injury of head, initial encounter: Secondary | ICD-10-CM | POA: Insufficient documentation

## 2024-04-07 DIAGNOSIS — M19012 Primary osteoarthritis, left shoulder: Secondary | ICD-10-CM | POA: Diagnosis not present

## 2024-04-07 DIAGNOSIS — J449 Chronic obstructive pulmonary disease, unspecified: Secondary | ICD-10-CM | POA: Diagnosis not present

## 2024-04-07 DIAGNOSIS — S40012A Contusion of left shoulder, initial encounter: Secondary | ICD-10-CM | POA: Diagnosis not present

## 2024-04-07 DIAGNOSIS — W19XXXA Unspecified fall, initial encounter: Secondary | ICD-10-CM | POA: Diagnosis not present

## 2024-04-07 DIAGNOSIS — S8002XA Contusion of left knee, initial encounter: Secondary | ICD-10-CM | POA: Diagnosis not present

## 2024-04-07 DIAGNOSIS — G319 Degenerative disease of nervous system, unspecified: Secondary | ICD-10-CM | POA: Diagnosis not present

## 2024-04-07 DIAGNOSIS — G8911 Acute pain due to trauma: Secondary | ICD-10-CM | POA: Diagnosis not present

## 2024-04-07 DIAGNOSIS — I482 Chronic atrial fibrillation, unspecified: Secondary | ICD-10-CM | POA: Insufficient documentation

## 2024-04-07 DIAGNOSIS — R609 Edema, unspecified: Secondary | ICD-10-CM | POA: Diagnosis not present

## 2024-04-07 DIAGNOSIS — S8992XA Unspecified injury of left lower leg, initial encounter: Secondary | ICD-10-CM | POA: Diagnosis not present

## 2024-04-07 DIAGNOSIS — Z7901 Long term (current) use of anticoagulants: Secondary | ICD-10-CM | POA: Diagnosis not present

## 2024-04-07 DIAGNOSIS — Z96643 Presence of artificial hip joint, bilateral: Secondary | ICD-10-CM | POA: Diagnosis not present

## 2024-04-07 DIAGNOSIS — S43402A Unspecified sprain of left shoulder joint, initial encounter: Secondary | ICD-10-CM | POA: Diagnosis not present

## 2024-04-07 DIAGNOSIS — M25462 Effusion, left knee: Secondary | ICD-10-CM | POA: Diagnosis not present

## 2024-04-07 DIAGNOSIS — M25552 Pain in left hip: Secondary | ICD-10-CM | POA: Diagnosis not present

## 2024-04-07 DIAGNOSIS — I10 Essential (primary) hypertension: Secondary | ICD-10-CM | POA: Diagnosis not present

## 2024-04-07 DIAGNOSIS — S8392XA Sprain of unspecified site of left knee, initial encounter: Secondary | ICD-10-CM | POA: Diagnosis not present

## 2024-04-07 DIAGNOSIS — Z043 Encounter for examination and observation following other accident: Secondary | ICD-10-CM | POA: Diagnosis not present

## 2024-04-07 DIAGNOSIS — M1712 Unilateral primary osteoarthritis, left knee: Secondary | ICD-10-CM | POA: Diagnosis not present

## 2024-04-07 LAB — COMPREHENSIVE METABOLIC PANEL WITH GFR
ALT: 22 U/L (ref 0–44)
AST: 28 U/L (ref 15–41)
Albumin: 3.4 g/dL — ABNORMAL LOW (ref 3.5–5.0)
Alkaline Phosphatase: 58 U/L (ref 38–126)
Anion gap: 13 (ref 5–15)
BUN: 12 mg/dL (ref 8–23)
CO2: 30 mmol/L (ref 22–32)
Calcium: 8.8 mg/dL — ABNORMAL LOW (ref 8.9–10.3)
Chloride: 97 mmol/L — ABNORMAL LOW (ref 98–111)
Creatinine, Ser: 1.06 mg/dL — ABNORMAL HIGH (ref 0.44–1.00)
GFR, Estimated: 52 mL/min — ABNORMAL LOW (ref >60)
Glucose, Bld: 109 mg/dL — ABNORMAL HIGH (ref 70–99)
Potassium: 2.8 mmol/L — ABNORMAL LOW (ref 3.5–5.1)
Sodium: 140 mmol/L (ref 135–145)
Total Bilirubin: 1 mg/dL (ref 0.0–1.2)
Total Protein: 7.1 g/dL (ref 6.5–8.1)

## 2024-04-07 LAB — CBC
HCT: 41.2 % (ref 36.0–46.0)
Hemoglobin: 13 g/dL (ref 12.0–15.0)
MCH: 29 pg (ref 26.0–34.0)
MCHC: 31.6 g/dL (ref 30.0–36.0)
MCV: 91.8 fL (ref 80.0–100.0)
Platelets: 368 K/uL (ref 150–400)
RBC: 4.49 MIL/uL (ref 3.87–5.11)
RDW: 13.3 % (ref 11.5–15.5)
WBC: 12.4 K/uL — ABNORMAL HIGH (ref 4.0–10.5)
nRBC: 0 % (ref 0.0–0.2)

## 2024-04-07 MED ORDER — ACETAMINOPHEN 325 MG PO TABS
650.0000 mg | ORAL_TABLET | Freq: Once | ORAL | Status: AC
  Administered 2024-04-07: 650 mg via ORAL
  Filled 2024-04-07: qty 2

## 2024-04-07 MED ORDER — POTASSIUM CHLORIDE CRYS ER 20 MEQ PO TBCR
40.0000 meq | EXTENDED_RELEASE_TABLET | Freq: Once | ORAL | Status: AC
  Administered 2024-04-07: 40 meq via ORAL
  Filled 2024-04-07: qty 2

## 2024-04-07 NOTE — ED Notes (Signed)
 Attempted to ambulate. Patient states she's unable to place pressure on left leg. Ace wrap applied and pain has improved but unable to ambulate. ED provider made aware. Patient returned to bed. Safety precautions in place. Call light within reach

## 2024-04-07 NOTE — ED Triage Notes (Signed)
 Pt from home via ems- lives home alone. Had two falls yesterday. Pt reports one fall at the mailbox when she dropped a piece of mail and she lost her balance bending over to pick it up, pt reports hitting head that time but no LOC. Takes coumadin. Pt reports the second time she fell she slipped off the toilet and was unable to get up so laid on the floor since 11pm last night. Pt A/O x 4. C/o pain to left leg (knee) and left shoulder.

## 2024-04-07 NOTE — ED Provider Notes (Signed)
 [facility] Provider Note    Event Date/Time   First MD Initiated Contact with Patient 04/07/24 [phone]     (approximate)   History   Fall and Leg Pain   HPI  Kelly Olsen is a 83 y.o. female with a past medical history of atrial fibrillation, COPD on Coumadin who presents after a fall.  Patient reports she had a minor fall because she lost her balance when getting the mail yesterday.  Later she fell off of the toilet and was unable to get herself up, she was on the bathroom floor the whole night, complains of left shoulder pain, left hip pain and left knee pain, no other injuries reported.  No headache.  No neurodeficits.     Physical Exam   Triage Vital Signs: ED Triage Vitals  Encounter Vitals Group     BP 04/07/24 1004 124/83     Girls Systolic BP Percentile --      Girls Diastolic BP Percentile --      Boys Systolic BP Percentile --      Boys Diastolic BP Percentile --      Pulse Rate 04/07/24 1004 75     Resp 04/07/24 1004 17     Temp 04/07/24 1004 98.3 F (36.8 C)     Temp Source 04/07/24 1004 Oral     SpO2 04/07/24 1004 100 %     Weight 04/07/24 1002 80 kg (176 lb 5.9 oz)     Height 04/07/24 1002 1.676 m (5' 6)     Head Circumference --      Peak Flow --      Pain Score 04/07/24 1001 10     Pain Loc --      Pain Education --      Exclude from Growth Chart --     Most recent vital signs: Vitals:   04/07/24 1330 04/07/24 1425  BP: (!) 138/49   Pulse: 80   Resp: 20   Temp:  97.6 F (36.4 C)  SpO2: 96%      General: Awake, no distress.  CV:  Good peripheral perfusion.  Resp:  Normal effort.  Abd:  No distention.  Other:  Left knee: Mild swelling primarily laterally, painful extension.  Mild discomfort with axial load on the left hip.  Painful abduction of the left shoulder, no bony abnormalities palpated, extremities are warm and well-perfused, no neurodeficits.  No chest wall pain.  No vertebral palpation.  No evidence of head  trauma.   ED Results / Procedures / Treatments   Labs (all labs ordered are listed, but only abnormal results are displayed) Labs Reviewed  CBC - Abnormal; Notable for the following components:      Result Value   WBC 12.4 (*)    All other components within normal limits  COMPREHENSIVE METABOLIC PANEL WITH GFR - Abnormal; Notable for the following components:   Potassium 2.8 (*)    Chloride 97 (*)    Glucose, Bld 109 (*)    Creatinine, Ser 1.06 (*)    Calcium 8.8 (*)    Albumin 3.4 (*)    GFR, Estimated 52 (*)    All other components within normal limits     EKG  ED ECG REPORT I, [name], the attending physician, personally viewed and interpreted this ECG.  Date: 04/07/2024  Rhythm: Atrial fibrillation QRS Axis: normal Intervals: normal ST/T Wave abnormalities: normal Narrative Interpretation: no evidence of acute ischemia    RADIOLOGY  PROCEDURES:  Critical Care performed:   Procedures   MEDICATIONS ORDERED IN ED: Medications  acetaminophen  (TYLENOL ) tablet 650 mg (650 mg Oral Given 04/07/24 1111)  potassium chloride  SA (KLOR-CON  M) CR tablet 40 mEq (40 mEq Oral Given 04/07/24 1339)     IMPRESSION / MDM / ASSESSMENT AND PLAN / ED COURSE  I reviewed the triage vital signs and the nursing notes. Patient's presentation is most consistent with acute presentation with potential threat to life or bodily function.  Patient presents after fall as detailed above, differential includes hip fracture/contusion, knee fracture/contusion/sprain, proximal humerus injury  Will send for x-rays, she has declined narcotic pain medication but excepts p.o. Tylenol   Strays are negative for fracture which is reassuring, we attempted to ambulate the patient, she was able to bear weight on her left leg but felt that she could not move her right leg forward I think this was out of fear that her left leg would give out.  She does complain of significant pain in her left  knee.  Will send for further imaging of the left knee to rule out tibial plateau fracture or other abnormality  CT of the left knee and CT head are reassuring  Patient was able to ambulate well with a [name] to the bathroom, she is adamant that she wants to go home and that she has a plan to be safe at home.  We will discharge with a [name].  Strict return precautions, follow-up with orthopedics for left shoulder strain, left knee contusion, recommend Tylenol  for analgesia      FINAL CLINICAL IMPRESSION(S) / ED DIAGNOSES   Final diagnoses:  Fall, initial encounter  Contusion of left knee, initial encounter  Sprain of left shoulder, unspecified shoulder sprain type, initial encounter     Rx / DC Orders   ED Discharge Orders     None        Note:  This document was prepared using Dragon voice recognition software and may include unintentional dictation errors.   Arlander Charleston, MD 04/07/24 1504

## 2024-04-10 ENCOUNTER — Other Ambulatory Visit: Payer: Self-pay | Admitting: *Deleted

## 2024-04-10 DIAGNOSIS — N200 Calculus of kidney: Secondary | ICD-10-CM

## 2024-04-12 DIAGNOSIS — M1712 Unilateral primary osteoarthritis, left knee: Secondary | ICD-10-CM | POA: Diagnosis not present

## 2024-04-12 DIAGNOSIS — S83105A Unspecified dislocation of left knee, initial encounter: Secondary | ICD-10-CM | POA: Diagnosis not present

## 2024-04-12 NOTE — Progress Notes (Signed)
 This note has been created using automated tools and reviewed for accuracy by [facility].  Chief Complaint  Patient presents with  . Left Knee - Pain  . Left Shoulder - Pain    Subjective  Kelly Olsen is a 83 y.o. female who presents for Pain of the Left Knee and Pain of the Left Shoulder HPI History of Present Illness Kelly Olsen is an 83 year old female with severe osteoarthritis who presents with left knee pain after a fall.  She experienced left knee pain following a fall on April 07, 2024, when she lost her balance while standing up after bending down to pick up a letter at [employer]. No loss of consciousness occurred during the incident.  She has a history of severe arthritis in the left knee, particularly in the lateral compartment and patellofemoral joints, with complete loss of joint space and significant spurring as shown in recent imaging. She received a gel injection for her knee arthritis about a month ago at [facility].  Since the fall, she has experienced significant swelling, a sensation of instability, and increased pain in the left knee. She initially did not require a [place] but is now using one for mobility. Her shoulder, which was also injured during the fall, is improving.  She is currently on Coumadin  for a heart condition and has a pacemaker.  She lives alone and is concerned about managing her condition independently.  Review of Systems  Patient Active Problem List  Diagnosis  . COPD (chronic obstructive pulmonary disease) (CMS/HHS-HCC)  . Sinoatrial node dysfunction  . Gastroesophageal reflux disease without esophagitis  . Essential hypertension  . Medicare annual wellness visit, initial (02/09/19)  . H/O tricuspid valve repair  . Venous insufficiency of both lower extremities  . Severe tricuspid valve insufficiency  . Persistent atrial fibrillation (CMS/HHS-HCC)  . Asthma (HHS-HCC)  . Gastro-esophageal reflux disease with esophagitis  .  Congestive heart failure (CMS/HHS-HCC)  . Heartburn  . Carpal tunnel syndrome, left  . Carpal tunnel syndrome, right  . Polymyalgia rheumatica syndrome (CMS/HHS-HCC)  . Age-related osteoporosis without current pathological fracture  . Primary osteoarthritis involving multiple joints  . Long term current use of systemic steroids    Outpatient Medications Prior to Visit  Medication Sig Dispense Refill  . albuterol  90 mcg/actuation inhaler Inhale 2 inhalations into the lungs every 4 (four) hours as needed for Wheezing 1 each 0  . budesonide  (PULMICORT ) 0.5 mg/2 mL nebulizer solution USE 1 VIAL  IN  NEBULIZER TWICE  DAILY - Rinse Mouth After Treatment 1 mL 11  . CALCIUM-VITAMIN D3 ORAL Take by mouth    . carvediloL (COREG) 25 MG tablet TAKE 1 TABLET(25 MG) BY MOUTH TWICE DAILY WITH MEALS 180 tablet 1  . dilTIAZem (CARDIZEM CD) 120 MG XR capsule Take 1 capsule (120 mg total) by mouth once daily 30 capsule 11  . DULoxetine  (CYMBALTA ) 20 MG DR capsule     . DULoxetine  (CYMBALTA ) 60 MG DR capsule Take 1 capsule (60 mg total) by mouth once daily 90 capsule 1  . dupilumab (DUPIXENT) 300 mg/2 mL pen injector Inject 2 mLs (300 mg total) subcutaneously every 14 (fourteen) days 4 mL 11  . EPINEPHrine (EPIPEN) 0.3 mg/0.3 mL auto-injector Inject 0.3 mLs into the muscle once as needed    . gabapentin  (NEURONTIN ) 600 MG tablet Take 1 tablet (600 mg total) by mouth at bedtime 90 tablet 1  . inhalational spacer (AEROCHAMBER) spacer Please instruct patient on use 1 each  2  . ipratropium-albuteroL  (COMBIVENT RESPIMAT) 20-100 mcg/actuation inhaler Inhale into the lungs    . KEVZARA 200 mg/1.14 mL subcutaneous inj Inject 200 mg subcutaneously once    . losartan (COZAAR) 50 MG tablet TAKE 1 TABLET(50 MG) BY MOUTH DAILY 90 tablet 0  . montelukast (SINGULAIR) 10 mg tablet TAKE 1 TABLET(10 MG) BY MOUTH AT BEDTIME 90 tablet 1  . multivitamin tablet Take 1 tablet by mouth once daily    . nortriptyline  (PAMELOR ) 10 MG  capsule Take 1 capsule (10 mg total) by mouth at bedtime 30 capsule 5  . omeprazole (PRILOSEC) 40 MG DR capsule Take 1 capsule (40 mg total) by mouth 2 (two) times daily 180 capsule 3  . potassium 99 mg Tab Take 1 tablet by mouth 2 (two) times daily    . predniSONE (DELTASONE) 5 MG tablet Take 1 tablet (5 mg total) by mouth once daily Take as directed - 6 day taper    . tiZANidine (ZANAFLEX) 2 MG tablet 1/2 to 1 tablet daily as needed 15 tablet 0  . TORsemide (DEMADEX) 20 MG tablet Take 1 tablet (20 mg total) by mouth 2 (two) times daily 180 tablet 3  . warfarin (COUMADIN ) 3 MG tablet TAKE 1 TABLET(3 MG) BY MOUTH EVERY DAY 90 tablet 1  . albuterol  (PROVENTIL ) 2.5 mg /3 mL (0.083 %) nebulizer solution Take 3 mLs (2.5 mg total) by nebulization once daily for 90 days 75 mL 2  . alendronate (FOSAMAX) 70 MG tablet Take 1 tablet (70 mg total) by mouth every 7 (seven) days Take on an empty stomach with a full glass of water. Avoid mineral or well water. Do not eat or take other medications for at least 30 minutes after dose. Sit or stand for at least 30 minutes after dose. (Patient not taking: Reported on 04/12/2024) 4 tablet 11   No facility-administered medications prior to visit.      Objective  Vitals:   04/12/24 1407  Height: 170.2 cm (5' 7)   Body mass index is 31.32 kg/m.  Home Vitals:     Physical Exam Physical Exam MUSCULOSKELETAL: Large effusion in the left knee with range of motion from 15 to 105 degrees. Valgus deformity in the left knee is minimally correctable.  Constitutional: alert, in NAD, and communicates well   Results RADIOLOGY Knee X-ray: No fracture, severe arthritis, complete loss of joint space, significant spurring (04/07/2024) Hip X-ray: No fracture (04/07/2024)     Assessment/Plan:   Assessment & Plan Left knee osteoarthritis with large effusion and recent trauma   Severe osteoarthritis affects the left knee, especially in the lateral compartment and  patellofemoral joints, with complete joint space loss and significant spurring. A recent fall has worsened symptoms, causing swelling, instability, and pain. Imaging shows no fracture, but a large effusion is likely due to trauma and exacerbated by Coumadin  use. A possible Baker's cyst may be present, likely pre-existing due to severe osteoarthritis. Effusion could result from bleeding from a minor fracture not visible on CT, especially with anticoagulation therapy. Apply an ACE wrap during the day to reduce swelling and remove it at night. Encourage bending exercises to help expel fluid. Use heat if it provides relief, as it has been five days since the injury. Schedule a follow-up in two weeks to reassess. If symptoms persist, consider an x-ray. Ensure at least six months and one day have passed since the last gel injection before considering another. Diagnoses and all orders for this visit:  Primary  localized osteoarthritis of left knee  Acute traumatic internal derangement of left knee, initial encounter    This visit was coded based on medical decision making (MDM).           Future Appointments     Date/Time Provider Department Center Visit Type   04/26/2024 1:45 PM Kathlynn Ozell Pac, MD [facility] C ORTHO RETURN   04/27/2024 1:30 PM [facility] WEST PULM PFT [facility] C PFT   04/27/2024 2:00 PM Parris Manna, MD [facility] C RETURN VISIT   04/27/2024 2:45 PM [facility] WEST CARDIO NURSE POD B [facility] C NURSE VISIT   04/27/2024 2:45 PM [facility]ANTICO[facility] C ANTI-COAG   05/09/2024 2:30 PM Tobie Lady Plumb, MD [facility] C RETURN VISIT   09/07/2024 10:00 AM [facility] WEST LAB [facility] C LAB   09/14/2024 1:15 PM Whitaker, Selinda Moose, PA [facility] MARYL BROCKS [facility] OFFICE VISIT   09/21/2024 2:00 PM Alluri, Keller Grist, MD [facility] C FOLLOW UP       There are no Patient  Instructions on file for this visit.  An after visit summary was provided for the patient either in written format (printed) or through [facility].  This note has been created using automated tools and reviewed for accuracy by [facility].

## 2024-04-14 ENCOUNTER — Ambulatory Visit: Payer: Self-pay | Admitting: Urology

## 2024-04-18 DIAGNOSIS — J449 Chronic obstructive pulmonary disease, unspecified: Secondary | ICD-10-CM | POA: Diagnosis not present

## 2024-04-18 DIAGNOSIS — J4540 Moderate persistent asthma, uncomplicated: Secondary | ICD-10-CM | POA: Diagnosis not present

## 2024-04-20 ENCOUNTER — Other Ambulatory Visit: Payer: Self-pay

## 2024-04-20 ENCOUNTER — Encounter: Payer: Self-pay | Admitting: Emergency Medicine

## 2024-04-20 ENCOUNTER — Observation Stay
Admission: EM | Admit: 2024-04-20 | Discharge: 2024-04-22 | Disposition: A | Discharge status: Home / Self Care | Attending: Internal Medicine | Admitting: Internal Medicine

## 2024-04-20 DIAGNOSIS — R42 Dizziness and giddiness: Secondary | ICD-10-CM | POA: Diagnosis not present

## 2024-04-20 DIAGNOSIS — Z9889 Other specified postprocedural states: Secondary | ICD-10-CM

## 2024-04-20 DIAGNOSIS — J449 Chronic obstructive pulmonary disease, unspecified: Secondary | ICD-10-CM

## 2024-04-20 DIAGNOSIS — J45909 Unspecified asthma, uncomplicated: Secondary | ICD-10-CM | POA: Insufficient documentation

## 2024-04-20 DIAGNOSIS — Z952 Presence of prosthetic heart valve: Secondary | ICD-10-CM | POA: Insufficient documentation

## 2024-04-20 DIAGNOSIS — Z79899 Other long term (current) drug therapy: Secondary | ICD-10-CM | POA: Insufficient documentation

## 2024-04-20 DIAGNOSIS — Z96651 Presence of right artificial knee joint: Secondary | ICD-10-CM | POA: Diagnosis not present

## 2024-04-20 DIAGNOSIS — M353 Polymyalgia rheumatica: Secondary | ICD-10-CM | POA: Diagnosis present

## 2024-04-20 DIAGNOSIS — I4821 Permanent atrial fibrillation: Secondary | ICD-10-CM | POA: Insufficient documentation

## 2024-04-20 DIAGNOSIS — H81399 Other peripheral vertigo, unspecified ear: Principal | ICD-10-CM

## 2024-04-20 DIAGNOSIS — Z950 Presence of cardiac pacemaker: Secondary | ICD-10-CM | POA: Insufficient documentation

## 2024-04-20 DIAGNOSIS — I4891 Unspecified atrial fibrillation: Secondary | ICD-10-CM | POA: Diagnosis not present

## 2024-04-20 DIAGNOSIS — I482 Chronic atrial fibrillation, unspecified: Secondary | ICD-10-CM

## 2024-04-20 DIAGNOSIS — I10 Essential (primary) hypertension: Secondary | ICD-10-CM | POA: Insufficient documentation

## 2024-04-20 DIAGNOSIS — I495 Sick sinus syndrome: Secondary | ICD-10-CM

## 2024-04-20 DIAGNOSIS — R112 Nausea with vomiting, unspecified: Secondary | ICD-10-CM | POA: Diagnosis not present

## 2024-04-20 DIAGNOSIS — Z7901 Long term (current) use of anticoagulants: Secondary | ICD-10-CM

## 2024-04-20 DIAGNOSIS — R9431 Abnormal electrocardiogram [ECG] [EKG]: Secondary | ICD-10-CM | POA: Diagnosis not present

## 2024-04-20 LAB — CBC
HCT: 39.4 % (ref 36.0–46.0)
Hemoglobin: 12.9 g/dL (ref 12.0–15.0)
MCH: 29.7 pg (ref 26.0–34.0)
MCHC: 32.7 g/dL (ref 30.0–36.0)
MCV: 90.6 fL (ref 80.0–100.0)
Platelets: 380 K/uL (ref 150–400)
RBC: 4.35 MIL/uL (ref 3.87–5.11)
RDW: 13.3 % (ref 11.5–15.5)
WBC: 11.1 K/uL — ABNORMAL HIGH (ref 4.0–10.5)
nRBC: 0 % (ref 0.0–0.2)

## 2024-04-20 LAB — PROTIME-INR
INR: 2.9 — ABNORMAL HIGH (ref 0.8–1.2)
Prothrombin Time: 31.3 s — ABNORMAL HIGH (ref 11.4–15.2)

## 2024-04-20 LAB — COMPREHENSIVE METABOLIC PANEL WITH GFR
ALT: 21 U/L (ref 0–44)
AST: 38 U/L (ref 15–41)
Albumin: 3.4 g/dL — ABNORMAL LOW (ref 3.5–5.0)
Alkaline Phosphatase: 60 U/L (ref 38–126)
Anion gap: 16 — ABNORMAL HIGH (ref 5–15)
BUN: 13 mg/dL (ref 8–23)
CO2: 26 mmol/L (ref 22–32)
Calcium: 9.6 mg/dL (ref 8.9–10.3)
Chloride: 100 mmol/L (ref 98–111)
Creatinine, Ser: 0.84 mg/dL (ref 0.44–1.00)
GFR, Estimated: >60 mL/min (ref >60)
Glucose, Bld: 109 mg/dL — ABNORMAL HIGH (ref 70–99)
Potassium: 4.2 mmol/L (ref 3.5–5.1)
Sodium: 142 mmol/L (ref 135–145)
Total Bilirubin: 0.9 mg/dL (ref 0.0–1.2)
Total Protein: 7.5 g/dL (ref 6.5–8.1)

## 2024-04-20 LAB — CBG MONITORING, ED: Glucose-Capillary: 100 mg/dL — ABNORMAL HIGH (ref 70–99)

## 2024-04-20 MED ORDER — SODIUM CHLORIDE 0.9 % IV BOLUS
500.0000 mL | Freq: Once | INTRAVENOUS | Status: AC
Start: 2024-04-20 — End: 2024-04-21
  Administered 2024-04-20: 500 mL via INTRAVENOUS

## 2024-04-20 MED ORDER — ONDANSETRON HCL 4 MG/2ML IJ SOLN
4.0000 mg | Freq: Once | INTRAMUSCULAR | Status: AC | PRN
Start: 2024-04-20 — End: 2024-04-20
  Administered 2024-04-20: 4 mg via INTRAVENOUS
  Filled 2024-04-20: qty 2

## 2024-04-20 MED ORDER — MECLIZINE HCL 25 MG PO TABS
25.0000 mg | ORAL_TABLET | Freq: Once | ORAL | Status: AC
  Administered 2024-04-20: 25 mg via ORAL
  Filled 2024-04-20: qty 1

## 2024-04-20 NOTE — ED Provider Notes (Signed)
 [facility] Provider Note    Event Date/Time   First MD Initiated Contact with Patient 04/20/24 2301     (approximate)   History   No chief complaint on file.   HPI  Kelly Olsen is a 83 y.o. female  ***with history of COPD, hypertension, GERD, atrial fibrillation on Coumadin , CHF, and polymyalgia rheumatica who presents with  I reviewed the past medical records.  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  She was seen in the ED for a fall on 9/12.   Physical Exam   Triage Vital Signs: ED Triage Vitals  Encounter Vitals Group     BP 04/20/24 2207 (!) 107/96     Girls Systolic BP Percentile --      Girls Diastolic BP Percentile --      Boys Systolic BP Percentile --      Boys Diastolic BP Percentile --      Pulse Rate 04/20/24 2207 69     Resp 04/20/24 2207 18     Temp 04/20/24 2207 98.1 F (36.7 C)     Temp Source 04/20/24 2207 Oral     SpO2 04/20/24 2206 96 %     Weight 04/20/24 2208 176 lb 5.9 oz (80 kg)     Height 04/20/24 2208 5' 6 (1.676 m)     Head Circumference --      Peak Flow --      Pain Score 04/20/24 2207 0     Pain Loc --      Pain Education --      Exclude from Growth Chart --     Most recent vital signs: Vitals:   04/20/24 2206 04/20/24 2207  BP:  (!) 107/96  Pulse:  69  Resp:  18  Temp:  98.1 F (36.7 C)  SpO2: 96% 96%    {Only need to document appropriate and relevant physical exam:1} General: Awake, no distress. *** CV:  Good peripheral perfusion. *** Resp:  Normal effort. *** Abd:  No distention. *** Other:  ***   ED Results / Procedures / Treatments   Labs (all labs ordered are listed, but only abnormal results are displayed) Labs Reviewed  COMPREHENSIVE METABOLIC PANEL WITH GFR - Abnormal; Notable for the following components:      Result Value   Glucose, Bld 109 (*)    Albumin 3.4 (*)    Anion gap 16 (*)    All  other components within normal limits  CBC - Abnormal; Notable for the following components:   WBC 11.1 (*)    All other components within normal limits  PROTIME-INR - Abnormal; Notable for the following components:   Prothrombin Time 31.3 (*)    INR 2.9 (*)    All other components within normal limits  CBG MONITORING, ED - Abnormal; Notable for the following components:   Glucose-Capillary 100 (*)    All other components within normal limits  URINALYSIS, ROUTINE W REFLEX MICROSCOPIC     EKG  ED ECG REPORT I, Waylon Cassis, the attending physician, personally viewed and interpreted this ECG.  Date: 04/20/2024 EKG Time: 2237 Rate: 70 Rhythm: Ventricular paced rhythm QRS Axis: normal Intervals: normal ST/T Wave abnormalities: normal Narrative Interpretation: no evidence of acute ischemia    RADIOLOGY *** {USE THE WORD INTERPRETED!! You MUST document your own interpretation of imaging, as well as the fact that you reviewed the radiologist's report!:1}  PROCEDURES:  Critical Care performed: {CriticalCareYesNo:19197::Yes, see critical care procedure note(s),No}  Procedures   MEDICATIONS ORDERED IN ED: Medications  ondansetron  (ZOFRAN ) injection 4 mg (4 mg Intravenous Given 04/20/24 2215)     IMPRESSION / MDM / ASSESSMENT AND PLAN / ED COURSE  I reviewed the triage vital signs and the nursing notes.                              Differential diagnosis includes, but is not limited to, ***  Patient's presentation is most consistent with {EM COPA:27473}  *** {If the patient is on the monitor, remove the brackets and asterisks on the sentence below and remember to document it as a Procedure as well. Otherwise delete the sentence below:1} {**The patient is on the cardiac monitor to evaluate for evidence of arrhythmia and/or significant heart rate changes.    FINAL CLINICAL IMPRESSION(S) / ED DIAGNOSES   Final diagnoses:  None     Rx / DC Orders    ED Discharge Orders     None        Note:  This document was prepared using Dragon voice recognition software and may include unintentional dictation errors.

## 2024-04-20 NOTE — ED Notes (Signed)
 Repeat EKG in 20 min per Dr. Levander.

## 2024-04-20 NOTE — ED Triage Notes (Signed)
 Pt arrives via ems from home c/o dizziness and n/v that started around noon today, hx of vertigo. Pt denies pain or sob. NADN.

## 2024-04-21 ENCOUNTER — Emergency Department

## 2024-04-21 DIAGNOSIS — I4821 Permanent atrial fibrillation: Secondary | ICD-10-CM

## 2024-04-21 DIAGNOSIS — R42 Dizziness and giddiness: Secondary | ICD-10-CM

## 2024-04-21 DIAGNOSIS — Z7901 Long term (current) use of anticoagulants: Secondary | ICD-10-CM

## 2024-04-21 DIAGNOSIS — I495 Sick sinus syndrome: Secondary | ICD-10-CM

## 2024-04-21 DIAGNOSIS — J449 Chronic obstructive pulmonary disease, unspecified: Secondary | ICD-10-CM

## 2024-04-21 LAB — URINALYSIS, ROUTINE W REFLEX MICROSCOPIC
Bilirubin Urine: NEGATIVE
Glucose, UA: NEGATIVE mg/dL
Hgb urine dipstick: NEGATIVE
Ketones, ur: NEGATIVE mg/dL
Leukocytes,Ua: NEGATIVE
Nitrite: NEGATIVE
Protein, ur: NEGATIVE mg/dL
Specific Gravity, Urine: 1.01 (ref 1.005–1.030)
pH: 7 (ref 5.0–8.0)

## 2024-04-21 LAB — PROTIME-INR
INR: 2.5 — ABNORMAL HIGH (ref 0.8–1.2)
Prothrombin Time: 28.6 s — ABNORMAL HIGH (ref 11.4–15.2)

## 2024-04-21 LAB — GLUCOSE, CAPILLARY: Glucose-Capillary: 87 mg/dL (ref 70–99)

## 2024-04-21 MED ORDER — ALBUTEROL SULFATE (2.5 MG/3ML) 0.083% IN NEBU: 2.5000 mg | INHALATION_SOLUTION | RESPIRATORY_TRACT | Status: DC | PRN

## 2024-04-21 MED ORDER — WARFARIN SODIUM 3 MG PO TABS
3.0000 mg | ORAL_TABLET | Freq: Once | ORAL | Status: AC
Start: 2024-04-21 — End: 2024-04-21
  Administered 2024-04-21: 3 mg via ORAL
  Filled 2024-04-21: qty 1

## 2024-04-21 MED ORDER — WARFARIN - PHARMACIST DOSING INPATIENT
Freq: Every day | Status: DC
  Filled 2024-04-21: qty 1

## 2024-04-21 MED ORDER — GABAPENTIN 300 MG PO CAPS
600.0000 mg | ORAL_CAPSULE | Freq: Every day | ORAL | Status: DC
Start: 2024-04-21 — End: 2024-04-22
  Administered 2024-04-21: 600 mg via ORAL
  Filled 2024-04-21: qty 2

## 2024-04-21 MED ORDER — WARFARIN SODIUM 3 MG PO TABS
3.0000 mg | ORAL_TABLET | ORAL | Status: DC
  Filled 2024-04-21: qty 1

## 2024-04-21 MED ORDER — DULOXETINE HCL 30 MG PO CPEP
60.0000 mg | ORAL_CAPSULE | Freq: Every day | ORAL | Status: DC
  Administered 2024-04-21 – 2024-04-22 (×2): 60 mg via ORAL
  Filled 2024-04-21 (×2): qty 2

## 2024-04-21 MED ORDER — PROCHLORPERAZINE EDISYLATE 10 MG/2ML IJ SOLN: 10.0000 mg | INTRAMUSCULAR | Status: DC | PRN

## 2024-04-21 MED ORDER — DIAZEPAM 5 MG PO TABS
5.0000 mg | ORAL_TABLET | Freq: Once | ORAL | Status: AC
  Administered 2024-04-21: 5 mg via ORAL
  Filled 2024-04-21: qty 1

## 2024-04-21 MED ORDER — NORTRIPTYLINE HCL 10 MG PO CAPS
10.0000 mg | ORAL_CAPSULE | Freq: Every day | ORAL | Status: DC
  Administered 2024-04-21: 10 mg via ORAL
  Filled 2024-04-21: qty 1

## 2024-04-21 MED ORDER — MECLIZINE HCL 25 MG PO TABS
25.0000 mg | ORAL_TABLET | Freq: Three times a day (TID) | ORAL | Status: DC
  Administered 2024-04-21 – 2024-04-22 (×4): 25 mg via ORAL
  Filled 2024-04-21 (×5): qty 1

## 2024-04-21 MED ORDER — ONDANSETRON HCL 4 MG/2ML IJ SOLN: 4.0000 mg | Freq: Four times a day (QID) | INTRAMUSCULAR | Status: DC | PRN

## 2024-04-21 MED ORDER — HYDROCODONE-ACETAMINOPHEN 5-325 MG PO TABS: 1.0000 | ORAL_TABLET | ORAL | Status: DC | PRN

## 2024-04-21 MED ORDER — BUDESONIDE 0.5 MG/2ML IN SUSP
0.5000 mg | Freq: Two times a day (BID) | RESPIRATORY_TRACT | Status: DC
  Administered 2024-04-21 – 2024-04-22 (×3): 0.5 mg via RESPIRATORY_TRACT
  Filled 2024-04-21 (×3): qty 2

## 2024-04-21 MED ORDER — WARFARIN SODIUM 1 MG PO TABS: 1.5000 mg | ORAL_TABLET | ORAL | Status: DC

## 2024-04-21 MED ORDER — ONDANSETRON HCL 4 MG PO TABS: 4.0000 mg | ORAL_TABLET | Freq: Four times a day (QID) | ORAL | Status: DC | PRN

## 2024-04-21 MED ORDER — MECLIZINE HCL 25 MG PO TABS: 25.0000 mg | ORAL_TABLET | Freq: Three times a day (TID) | ORAL | Status: DC | PRN

## 2024-04-21 MED ORDER — PANTOPRAZOLE SODIUM 40 MG PO TBEC
40.0000 mg | DELAYED_RELEASE_TABLET | Freq: Every day | ORAL | Status: DC
  Administered 2024-04-21 – 2024-04-22 (×2): 40 mg via ORAL
  Filled 2024-04-21 (×2): qty 1

## 2024-04-21 MED ORDER — ACETAMINOPHEN 650 MG RE SUPP: 650.0000 mg | Freq: Four times a day (QID) | RECTAL | Status: DC | PRN

## 2024-04-21 MED ORDER — ALBUTEROL SULFATE (2.5 MG/3ML) 0.083% IN NEBU
2.5000 mg | INHALATION_SOLUTION | Freq: Every evening | RESPIRATORY_TRACT | Status: DC
  Administered 2024-04-21: 2.5 mg via RESPIRATORY_TRACT
  Filled 2024-04-21: qty 3

## 2024-04-21 MED ORDER — DEXAMETHASONE SODIUM PHOSPHATE 10 MG/ML IJ SOLN
10.0000 mg | Freq: Three times a day (TID) | INTRAMUSCULAR | Status: DC
  Administered 2024-04-21: 10 mg via INTRAVENOUS
  Filled 2024-04-21: qty 1

## 2024-04-21 MED ORDER — ACETAMINOPHEN 325 MG PO TABS: 650.0000 mg | ORAL_TABLET | Freq: Four times a day (QID) | ORAL | Status: DC | PRN

## 2024-04-21 NOTE — Assessment & Plan Note (Signed)
 No acute issues Follows at [facility] cardiology, last seen August 2025

## 2024-04-21 NOTE — Progress Notes (Signed)
 PHARMACY - ANTICOAGULATION CONSULT NOTE  Pharmacy Consult for Warfarin  Indication: atrial fibrillation  Allergies  Allergen Reactions   Tramadol     Other reaction(s): Other (See Comments) Causes extreme sleepiness, wooziness   Butalbital-Apap-Caff-Cod     Other Reaction(s): Not available   Butalbital-Apap-Caffeine     Other Reaction(s): Other (See Comments)   Codeine Nausea Only   Amlodipine Rash    Patient Measurements: Height: 5' 6 (167.6 cm) Weight: 80 kg (176 lb 5.9 oz) IBW/kg (Calculated) : 59.3 HEPARIN DW (KG): 75.9  Vital Signs: Temp: 98.5 F (36.9 C) (09/26 0320) Temp Source: Oral (09/25 2207) BP: 136/66 (09/26 0400) Pulse Rate: 77 (09/26 0400)  Labs: Recent Labs    04/20/24 2212  HGB 12.9  HCT 39.4  PLT 380  LABPROT 31.3*  INR 2.9*  CREATININE 0.84    Estimated Creatinine Clearance: 55.1 mL/min (by C-G formula based on SCr of 0.84 mg/dL).   Medical History: Past Medical History:  Diagnosis Date   Aortic atherosclerosis    Arthritis    Atrial fibrillation (HCC)    a.) CHA2DS2VASc = 5 (age x2, sex, HTN, vascular disease history);  b.) s/p cardiac ablations x 2 (2004 and 2006) - records unavailable; c.) rate/rhythm maintained using carvedilol; chronically anticoagulated with warfarin   Bilateral carpal tunnel syndrome    a.) s/p BILATERAL release   COPD (chronic obstructive pulmonary disease) (HCC)    Eosinophilic asthma    a.) on biologic/mAB therapy (dupilumab)   GERD (gastroesophageal reflux disease)    History of 2019 novel coronavirus disease (COVID-19) 04/2022   History of bilateral cataract extraction    History of kidney stones    Hypertension    Long term current use of anticoagulant    a.) warfarin   Mild cardiomegaly    Neuropathy of both feet    Presence of permanent cardiac pacemaker 2009   a.) s/p placement of [employer] EL [id number] in 2009 ([place] ); b.) battery changed in 2018 ([place] )   SA node  dysfunction (HCC)    a.) s/p dual chamber PPM placement 2009   Severe tricuspid valve insufficiency    a.) s/p TV repair via median sternotomy in ~2005/2006 done in [place]     Medications:  (Not in a hospital admission)   Assessment: Pharmacy consulted to dose warfarin in this 83 year old female admitted with Afib.  Unsure of last dose  9/25:  INR @ 2212 = 2.9, therapeutic  Pt home warfarin dose:    Warfarin 1.5 mg PO Q Mon, Tue, Thurs, Sat and Sun  Warfarin  3 mg PO Wed and Fri                     Goal of Therapy:  INR 2-3    Plan:  - Will continue home warfarin dosing schedule and recheck INR on 9/26 with AM labs - CBC daily  Jelisha Weed D 04/21/2024,4:28 AM

## 2024-04-21 NOTE — Assessment & Plan Note (Signed)
No acute disease suspected ?

## 2024-04-21 NOTE — Progress Notes (Signed)
 Patient seen in the room. No family at bedside. Chart reviewed. Came in with acute onset dizziness/vertigo. Patient said he she had similar/last severe symptoms 3 to 4 years ago came all of a sudden. Denies any upper respiratory infection or any other precipitating event. MRI unable to do due to patient having pacemaker. CT head negative. Will continue meclizine  TID. Vestibular PT ordered. Patient overall feels low better. Able to tolerated PO diet. No more vomiting. Neurology nonfocal. Vitals and labs stable  NO charge

## 2024-04-21 NOTE — Assessment & Plan Note (Signed)
Not acutely exacerbated ?Continue home inhalers with DuoNebs as needed ?

## 2024-04-21 NOTE — Plan of Care (Signed)

## 2024-04-21 NOTE — H&P (Signed)
 History and Physical    Patient: Kelly Olsen FMW:[id number] DOB: [birth year] DOA: 04/20/2024 DOS: the patient was seen and examined on 04/21/2024 PCP: Cyrus Selinda Moose, PA-C  Patient coming from: Home  Chief Complaint:  Chief Complaint  Patient presents with   Dizziness    HPI: Kelly Olsen is a 83 y.o. female with medical history significant for Persistent A-fib s/p ablation x 2 on Coumadin , sinus node dysfunction s/p PPM, HTN, COPD being admitted with intractable vertigo unresolved with treatment in the ED.  Symptoms started around noon on 9/25 when she suddenly developed dizziness with change in position and feeling like the room is spinning and went on to vomit 3 times. Symptoms are better with lying flat but intractable when she is tries to stand up. She denies headache, visual disturbance or fever or chills or recent respiratory tract illness.  States that she had a much milder similar case about 4 years prior now resolved after couple days and saw ENT and was told she had a right inner ear problem. In the ED, vitals unremarkable. Labs notable for WBC 11,000, CMP with isolated abnormality of anion gap of 16 but otherwise normal.  INR therapeutic at 2.9 EKG showed a ventricular paced rhythm at 70 CT head nonacute patient treated with meclizine , Zofran  and an NS bolus as well as diazepam  however when ambulation was attempted in preparation for possible discharge, she was unable to tolerate sitting up or standing due to dizziness.. Admission requested     Review of Systems: As mentioned in the history of present illness. All other systems reviewed and are negative.  Past Medical History:  Diagnosis Date   Aortic atherosclerosis    Arthritis    Atrial fibrillation (HCC)    a.) CHA2DS2VASc = 5 (age x2, sex, HTN, vascular disease history);  b.) s/p cardiac ablations x 2 (2004 and 2006) - records unavailable; c.) rate/rhythm maintained using carvedilol; chronically anticoagulated  with warfarin   Bilateral carpal tunnel syndrome    a.) s/p BILATERAL release   COPD (chronic obstructive pulmonary disease) (HCC)    Eosinophilic asthma    a.) on biologic/mAB therapy (dupilumab)   GERD (gastroesophageal reflux disease)    History of 2019 novel coronavirus disease (COVID-19) 04/2022   History of bilateral cataract extraction    History of kidney stones    Hypertension    Long term current use of anticoagulant    a.) warfarin   Mild cardiomegaly    Neuropathy of both feet    Presence of permanent cardiac pacemaker 2009   a.) s/p placement of [employer] EL [id number] in 2009 ([place] ); b.) battery changed in 2018 ([place] )   SA node dysfunction (HCC)    a.) s/p dual chamber PPM placement 2009   Severe tricuspid valve insufficiency    a.) s/p TV repair via median sternotomy in ~2005/2006 done in [place]    Past Surgical History:  Procedure Laterality Date   BILATERAL CARPAL TUNNEL RELEASE Bilateral    CARDIAC ELECTROPHYSIOLOGY STUDY AND ABLATION N/A 2004   CARDIAC ELECTROPHYSIOLOGY STUDY AND ABLATION N/A 2006   CARDIAC VALVE SURGERY N/A    Procedure: TRICUSPID VALVE REPAIR; Location: [place]    CARPAL TUNNEL RELEASE Right 02/10/2023   Procedure: REVISION OPEN CARPAL TUNNEL RELEASE;  Surgeon: Edie Norleen PARAS, MD;  Location: [facility];  Service: Orthopedics;  Laterality: Right;  2nd case   CARPAL TUNNEL RELEASE Left 03/31/2023   Procedure: OPEN CARPAL TUNNEL RELEASE;  Surgeon: Edie Norleen PARAS, MD;  Location:  [facility] OR[facility];  Service: Orthopedics;  Laterality: Left;  2nd case   CATARACT EXTRACTION, BILATERAL     CHOLECYSTECTOMY     PACEMAKER BATTERY CHANGE N/A 2018   Procedure: PACEMAKER BATTERY CHANGE; Location: [place]    PACEMAKER INSERTION N/A 2009   Procedure: PACEMAKER INSERTION; Location: [place]    TOTAL HIP ARTHROPLASTY Bilateral    TOTAL KNEE ARTHROPLASTY Right    Social History:  reports that she has never smoked. She has never used smokeless  tobacco. She reports that she does not drink alcohol and does not use drugs.  Allergies  Allergen Reactions   Tramadol     Other reaction(s): Other (See Comments) Causes extreme sleepiness, wooziness   Butalbital-Apap-Caff-Cod     Other Reaction(s): Not available   Butalbital-Apap-Caffeine     Other Reaction(s): Other (See Comments)   Codeine Nausea Only   Amlodipine Rash    History reviewed. No pertinent family history.  Prior to Admission medications   Medication Sig Start Date End Date Taking? Authorizing Provider  acetaminophen  (TYLENOL ) 650 MG CR tablet Take 650 mg by mouth every 8 (eight) hours as needed for pain.    [provider]  albuterol  (PROVENTIL ) (2.5 MG/3ML) 0.083% nebulizer solution Take 2.5 mg by nebulization every evening.    [provider]  budesonide  (PULMICORT ) 0.5 MG/2ML nebulizer solution Take 0.5 mg by nebulization 2 (two) times daily.    [provider]  Calcium Carb-Cholecalciferol 1000-800 MG-UNIT TABS Take 2 tablets by mouth daily.    [provider]  carvedilol (COREG) 25 MG tablet Take 1 tablet by mouth 2 (two) times daily. 12/28/14   [provider]  DULoxetine  (CYMBALTA ) 60 MG capsule Take 60 mg by mouth daily.    [provider]  Dupilumab (DUPIXENT) 300 MG/2ML SOPN Inject 300 mg into the skin every 14 (fourteen) days.    [provider]  EPINEPHrine 0.3 mg/0.3 mL IJ SOAJ injection Inject 0.3 mg into the muscle as needed for anaphylaxis.    [provider]  gabapentin  (NEURONTIN ) 600 MG tablet Take 600 mg by mouth at bedtime. 03/16/22   [provider]  Ipratropium-Albuterol  (COMBIVENT) 20-100 MCG/ACT AERS respimat Inhale 2 puffs into the lungs every 6 (six) hours as needed.    [provider]  losartan (COZAAR) 50 MG tablet Take 1 tablet by mouth every morning. 01/30/15   [provider]  montelukast (SINGULAIR) 10 MG tablet Take 10 mg by mouth at bedtime.  01/08/22   [provider]  Multiple Vitamin (MULTIVITAMIN) capsule Take 1 capsule by mouth daily.    [provider]  omeprazole (PRILOSEC) 40 MG capsule Take 40 mg by mouth daily. 01/06/23   [provider]  Potassium 99 MG TABS Take 1 tablet by mouth 2 (two) times daily.    [provider]  torsemide (DEMADEX) 20 MG tablet Take 20 mg by mouth 2 (two) times daily. 02/08/22   [provider]  warfarin (COUMADIN ) 3 MG tablet Take 1 tablet by mouth daily. Take as directed (3 mg  on Wed and Fri then 1.5 mg every other day) 12/14/14   [provider]    Physical Exam: Vitals:   04/21/24 0200 04/21/24 0230 04/21/24 0320 04/21/24 0330  BP: (!) 152/64 (!) 153/69  134/77  Pulse: 81 71  71  Resp:  (!) 42  19  Temp:   98.5 F (36.9 C)   TempSrc:      SpO2: 97% 100%  100%  Weight:  Height:       Physical Exam Vitals and nursing note reviewed.  Constitutional:      General: She is not in acute distress. HENT:     Head: Normocephalic and atraumatic.  Cardiovascular:     Rate and Rhythm: Normal rate and regular rhythm.     Heart sounds: Normal heart sounds.  Pulmonary:     Effort: Pulmonary effort is normal.     Breath sounds: Normal breath sounds.  Abdominal:     Palpations: Abdomen is soft.     Tenderness: There is no abdominal tenderness.  Neurological:     Mental Status: Mental status is at baseline.     Labs on Admission: I have personally reviewed following labs and imaging studies  CBC: Recent Labs  Lab 04/20/24 2212  WBC 11.1*  HGB 12.9  HCT 39.4  MCV 90.6  PLT 380   Basic Metabolic Panel: Recent Labs  Lab 04/20/24 2212  NA 142  K 4.2  CL 100  CO2 26  GLUCOSE 109*  BUN 13  CREATININE 0.84  CALCIUM 9.6   GFR: Estimated Creatinine Clearance: 55.1 mL/min (by C-G formula based on SCr of 0.84 mg/dL). Liver Function Tests: Recent Labs  Lab 04/20/24 2212  AST 38  ALT 21  ALKPHOS 60  BILITOT 0.9  PROT  7.5  ALBUMIN 3.4*   No results for input(s): LIPASE, AMYLASE in the last 168 hours. No results for input(s): AMMONIA in the last 168 hours. Coagulation Profile: Recent Labs  Lab 04/20/24 2212  INR 2.9*   Cardiac Enzymes: No results for input(s): CKTOTAL, CKMB, CKMBINDEX, TROPONINI in the last 168 hours. BNP (last 3 results) No results for input(s): PROBNP in the last 8760 hours. HbA1C: No results for input(s): HGBA1C in the last 72 hours. CBG: Recent Labs  Lab 04/20/24 2234  GLUCAP 100*   Lipid Profile: No results for input(s): CHOL, HDL, LDLCALC, TRIG, CHOLHDL, LDLDIRECT in the last 72 hours. Thyroid Function Tests: No results for input(s): TSH, T4TOTAL, FREET4, T3FREE, THYROIDAB in the last 72 hours. Anemia Panel: No results for input(s): VITAMINB12, FOLATE, FERRITIN, TIBC, IRON, RETICCTPCT in the last 72 hours. Urine analysis:    Component Value Date/Time   COLORURINE YELLOW (A) 02/07/2015 1231   APPEARANCEUR Clear 03/25/2022 1040   LABSPEC 1.004 (L) 02/07/2015 1231   PHURINE 7.0 02/07/2015 1231   GLUCOSEU Negative 03/25/2022 1040   HGBUR 2+ (A) 02/07/2015 1231   BILIRUBINUR Negative 03/25/2022 1040   KETONESUR NEGATIVE 02/07/2015 1231   PROTEINUR Negative 03/25/2022 1040   PROTEINUR NEGATIVE 02/07/2015 1231   NITRITE Negative 03/25/2022 1040   NITRITE NEGATIVE 02/07/2015 1231   LEUKOCYTESUR 2+ (A) 03/25/2022 1040    Radiological Exams on Admission: CT Head Wo Contrast Result Date: 04/21/2024 CLINICAL DATA:  Vertigo, central EXAM: CT HEAD WITHOUT CONTRAST TECHNIQUE: Contiguous axial images were obtained from the base of the skull through the vertex without intravenous contrast. RADIATION DOSE REDUCTION: This exam was performed according to the departmental dose-optimization program which includes automated exposure control, adjustment of the mA and/or kV according to patient size and/or use of iterative  reconstruction technique. COMPARISON:  04/07/2024 FINDINGS: Brain: No acute intracranial abnormality. Specifically, no hemorrhage, hydrocephalus, mass lesion, acute infarction, or significant intracranial injury. Age related atrophy. Vascular: No hyperdense vessel or unexpected calcification. Skull: No acute calvarial abnormality. Sinuses/Orbits: No acute findings Other: None IMPRESSION: No acute intracranial abnormality. Electronically Signed   By: Franky Crease M.D.   On: 04/21/2024 03:26  Data Reviewed for HPI: Relevant notes from primary care and specialist visits, past discharge summaries as available in EHR, including Care Everywhere. Prior diagnostic testing as pertinent to current admission diagnoses Updated medications and problem lists for reconciliation ED course, including vitals, labs, imaging, treatment and response to treatment Triage notes, nursing and pharmacy notes and ED provider's notes Notable results as noted above in HPI      Assessment and Plan: * Vertigo, intractable Suspect peripheral Unable to get MRI to evaluate for central etiology Will trial dexamethasone  given intractable symptoms Continue meclizine , antiemetics Consider ENT consult if no improvement  COPD (chronic obstructive pulmonary disease) (HCC) Not acutely exacerbated Continue home inhalers with DuoNebs as needed  Permanent atrial fibrillation (HCC) On chronic anticoagulation with warfarin Continue carvedilol Continue Coumadin -pharmacy consulted for INR management-patient currently therapeutic  Polymyalgia rheumatica syndrome On chronic prednisone therapy 5 mg Will hold while on dexamethasone   H/O tricuspid valve repair No acute issues [facility]s [facility] cardiology, last seen August 2025  SA node dysfunction s/p pacemaker (HCC) No acute disease suspected     DVT prophylaxis: Lovenox  Consults: none  Advance Care Planning:   Code Status: Prior   Family Communication:  none  Disposition Plan: Back to previous home environment  Severity of Illness: The appropriate patient status for this patient is OBSERVATION. Observation status is judged to be reasonable and necessary in order to provide the required intensity of service to ensure the patient's safety. The patient's presenting symptoms, physical exam findings, and initial radiographic and laboratory data in the context of their medical condition is felt to place them at decreased risk for further clinical deterioration. Furthermore, it is anticipated that the patient will be medically stable for discharge from the hospital within 2 midnights of admission.   Author: Delayne LULLA Solian, MD 04/21/2024 4:06 AM  For on call review [email or web address].

## 2024-04-21 NOTE — ED Notes (Signed)
 This RN went to attempt to stand patient up to reassess the patient dizziness. The patient was unable to even sit on the side of the bed to stand due to the dizziness. Patient states when she starts to move at all, the dizziness comes back. Siadecki, MD made aware.

## 2024-04-21 NOTE — Assessment & Plan Note (Signed)
 On chronic anticoagulation with warfarin Continue carvedilol Continue Coumadin -pharmacy consulted for INR management-patient currently therapeutic

## 2024-04-21 NOTE — Evaluation (Signed)
 Physical Therapy Evaluation Patient Details Name: Kelly Olsen MRN: [id number] DOB: 1941/04/25 Today's Date: 04/21/2024  History of Present Illness  Kelly Olsen is an 83yoF who comes to [facility] after acute onset vertigo, pt immediately unable to standing upright without concern of keeling over. Pt reports baseline imbalance due to neuropathy, finally decided to use a RW 2 weeks prior after a fall wherein she did hit her head, imaging twice reassuring. CT here since arrival adn onset vertigo remains unremarkable. Pt unable to have MRI due to PPM.  Clinical Impression  Pt reports continued constant dizziness upon arrival, denies frank vertigo, however when her dizziness is exacerbated with movement it becomes more vertiginous in nature, causing more trunk instability. Eye exam is abnormal when advanced from abnormally slow movements, pt unable to maintain gaze stabilization with horizontal head movement. Performed a horizontal roll test, which did not exacerbate symptoms on Right, however did make symptoms worse on left side, hence went ahead and treated this side empirically, however, exam less suggestive of primary BPPV diagnosis, more consistent with vestibular neuritis, or other, would defer to neurology for ultimate diagnosis. After mobilization of canalith, pt did not show any improvement in presentation. Will continue to follow.       If plan is discharge home, recommend the following: A little help with walking and/or transfers;Help with stairs or ramp for entrance;Assist for transportation   Can travel by private vehicle        Equipment Recommendations None recommended by PT  Recommendations for Other Services       Functional Status Assessment Patient has had a recent decline in their functional status and demonstrates the ability to make significant improvements in function in a reasonable and predictable amount of time.     Precautions / Restrictions Precautions Precautions: Fall       Mobility  Bed Mobility Overal bed mobility: Needs Assistance Bed Mobility: Rolling, Supine to Sit, Sit to Supine Rolling: Contact guard assist   Supine to sit: Contact guard Sit to supine: Contact guard assist   General bed mobility comments: all labored, but capable; provokes dizziness    Transfers Overall transfer level: Needs assistance Equipment used: None Transfers: Sit to/from Stand             General transfer comment: provokes dizziness    Ambulation/Gait Ambulation/Gait assistance: Supervision, Contact guard assist Gait Distance (Feet): 12 Feet Assistive device:  (elevated bed rail (2 hand support))         General Gait Details: provokes vertigo, stops frequently to recoup  Stairs            Wheelchair Mobility     Tilt Bed    Modified Rankin (Stroke Patients Only)       Balance                                             Pertinent Vitals/Pain Pain Assessment Pain Assessment: No/denies pain    Home Living Family/patient expects to be discharged to:: Private residence Living Arrangements: Alone Available Help at Discharge: Family [facility], [place]) Type of Home: Apartment Home Access: Level entry       Home Layout: One level Home Equipment: Agricultural consultant (2 wheels) Additional Comments: RW use due to neuropathy, but started useing 2 weeks prior since fall 2 weeks PTA, hit head.    Prior Function Prior Level of  Function : Independent/Modified Independent                     Extremity/Trunk Assessment                Communication        Cognition Arousal: Alert Behavior During Therapy: WFL for tasks assessed/performed   PT - Cognitive impairments: No apparent impairments                                 Cueing       General Comments      Exercises Other Exercises Other Exercises: Treatment for (+) Lef thorizontal head roll Other Exercises: Rt horizontal head roll  negative   Assessment/Plan    PT Assessment Patient needs continued PT services  PT Problem List Decreased balance;Decreased mobility;Decreased coordination       PT Treatment Interventions Stair training;Gait training;Balance training;Neuromuscular re-education;Functional mobility training;Therapeutic activities;Patient/family education    PT Goals (Current goals can be found in the Care Plan section)  Acute Rehab PT Goals Patient Stated Goal: improve vertigo, treturn to independent mobility PT Goal Formulation: With patient Time For Goal Achievement: 05/05/24 Potential to Achieve Goals: Fair    Frequency Min 3X/week     Co-evaluation               AM-PAC PT 6 Clicks Mobility  Outcome Measure Help needed turning from your back to your side while in a flat bed without using bedrails?: None Help needed moving from lying on your back to sitting on the side of a flat bed without using bedrails?: None Help needed moving to and from a bed to a chair (including a wheelchair)?: A Little Help needed standing up from a chair using your arms (e.g., wheelchair or bedside chair)?: A Little Help needed to walk in hospital room?: A Little Help needed climbing 3-5 steps with a railing? : A Little 6 Click Score: 20    End of Session   Activity Tolerance: Patient tolerated treatment well;Other (comment) (dizziness) Patient left: in bed Nurse Communication: Other (comment) (gave results to MD) PT Visit Diagnosis: Unsteadiness on feet (R26.81);Dizziness and giddiness (R42);Other symptoms and signs involving the nervous system (R29.898)    Time: 9072-9046 PT Time Calculation (min) (ACUTE ONLY): 26 min   Charges:   PT Evaluation $PT Eval Moderate Complexity: 1 Mod PT Treatments $Therapeutic Activity: 8-22 mins $Canalith Rep Proc: 8-22 mins PT General Charges $$ ACUTE PT VISIT: 1 Visit    10:36 AM, 04/21/24 Peggye JAYSON Linear, PT, DPT Physical Therapist - [facility]  [phone] (ASCOM)    Karas Pickerill C 04/21/2024, 10:31 AM

## 2024-04-21 NOTE — Assessment & Plan Note (Signed)
 On chronic prednisone therapy 5 mg Will hold while on dexamethasone 

## 2024-04-21 NOTE — ED Notes (Signed)
 Pt reported urge to go to the bathroom. ED Rnx2 assisted pt to stand and pivot to bedside toilet. PT c/o increased dizziness upon sitting to edge of bed and standing.

## 2024-04-21 NOTE — Progress Notes (Signed)
 PHARMACY - ANTICOAGULATION CONSULT NOTE  Pharmacy Consult for Warfarin  Indication: atrial fibrillation  Allergies  Allergen Reactions   Tramadol     Other reaction(s): Other (See Comments) Causes extreme sleepiness, wooziness   Butalbital-Apap-Caff-Cod     Other Reaction(s): Not available   Butalbital-Apap-Caffeine     Other Reaction(s): Other (See Comments)   Codeine Nausea Only   Amlodipine Rash    Patient Measurements: Height: 5' 6 (167.6 cm) Weight: 80 kg (176 lb 5.9 oz) IBW/kg (Calculated) : 59.3 HEPARIN DW (KG): 75.9  Vital Signs: Temp: 98 F (36.7 C) (09/26 0457) Temp Source: Oral (09/26 0437) BP: 145/88 (09/26 0457) Pulse Rate: 71 (09/26 0457)  Labs: Recent Labs    04/20/24 2212 04/21/24 0555  HGB 12.9  --   HCT 39.4  --   PLT 380  --   LABPROT 31.3* 28.6*  INR 2.9* 2.5*  CREATININE 0.84  --     Estimated Creatinine Clearance: 55.1 mL/min (by C-G formula based on SCr of 0.84 mg/dL).   Medical History: Past Medical History:  Diagnosis Date   Aortic atherosclerosis    Arthritis    Atrial fibrillation (HCC)    a.) CHA2DS2VASc = 5 (age x2, sex, HTN, vascular disease history);  b.) s/p cardiac ablations x 2 (2004 and 2006) - records unavailable; c.) rate/rhythm maintained using carvedilol; chronically anticoagulated with warfarin   Bilateral carpal tunnel syndrome    a.) s/p BILATERAL release   COPD (chronic obstructive pulmonary disease) (HCC)    Eosinophilic asthma    a.) on biologic/mAB therapy (dupilumab)   GERD (gastroesophageal reflux disease)    History of 2019 novel coronavirus disease (COVID-19) 04/2022   History of bilateral cataract extraction    History of kidney stones    Hypertension    Long term current use of anticoagulant    a.) warfarin   Mild cardiomegaly    Neuropathy of both feet    Presence of permanent cardiac pacemaker 2009   a.) s/p placement of [employer] EL [id number] in 2009 ([place] ); b.) battery  changed in 2018 ([place] )   SA node dysfunction (HCC)    a.) s/p dual chamber PPM placement 2009   Severe tricuspid valve insufficiency    a.) s/p TV repair via median sternotomy in ~2005/2006 done in [place]     Medications:  Medications Prior to Admission  Medication Sig Dispense Refill Last Dose/Taking   carvedilol (COREG) 25 MG tablet Take 25 mg by mouth 2 (two) times daily with a meal.   Taking   losartan (COZAAR) 50 MG tablet Take 50 mg by mouth daily.   Taking   acetaminophen  (TYLENOL ) 650 MG CR tablet Take 650 mg by mouth every 8 (eight) hours as needed for pain.      albuterol  (PROVENTIL ) (2.5 MG/3ML) 0.083% nebulizer solution Take 2.5 mg by nebulization every evening.      budesonide  (PULMICORT ) 0.5 MG/2ML nebulizer solution Take 0.5 mg by nebulization 2 (two) times daily.      Calcium Carb-Cholecalciferol 1000-800 MG-UNIT TABS Take 2 tablets by mouth daily.      carvedilol (COREG) 25 MG tablet Take 1 tablet by mouth 2 (two) times daily.  1    diltiazem (CARDIZEM CD) 120 MG 24 hr capsule Take 120 mg by mouth daily.      DULoxetine  (CYMBALTA ) 20 MG capsule Take 20 mg by mouth daily.      DULoxetine  (CYMBALTA ) 60 MG capsule Take 60 mg by mouth daily.  Dupilumab (DUPIXENT) 300 MG/2ML SOPN Inject 300 mg into the skin every 14 (fourteen) days.      EPINEPHrine 0.3 mg/0.3 mL IJ SOAJ injection Inject 0.3 mg into the muscle as needed for anaphylaxis.      gabapentin  (NEURONTIN ) 600 MG tablet Take 600 mg by mouth at bedtime.      Ipratropium-Albuterol  (COMBIVENT) 20-100 MCG/ACT AERS respimat Inhale 2 puffs into the lungs every 6 (six) hours as needed.      losartan (COZAAR) 50 MG tablet Take 1 tablet by mouth every morning.  1    montelukast (SINGULAIR) 10 MG tablet Take 10 mg by mouth at bedtime.      Multiple Vitamin (MULTIVITAMIN) capsule Take 1 capsule by mouth daily.      nortriptyline  (PAMELOR ) 10 MG capsule Take 10 mg by mouth at bedtime.      omeprazole (PRILOSEC) 40 MG  capsule Take 40 mg by mouth daily.      Potassium 99 MG TABS Take 1 tablet by mouth 2 (two) times daily.      torsemide (DEMADEX) 20 MG tablet Take 20 mg by mouth 2 (two) times daily.      warfarin (COUMADIN ) 3 MG tablet Take 1 tablet by mouth daily. Take as directed (3 mg  on Wed and Fri then 1.5 mg every other day)  2     Assessment: 83 yo F admitted with dizziness, nausea & vomiting in the setting of vertigo. PMH includes permanent Afib s/p ablationx2, s/p PPM, severe tricuspid valve insufficiency s/p repair, HTN, COPD for which patient takes warfarin. After speaking with patient, her oral intake has been down significantly the past few days due to N&V, however she did miss her dose of warfarin yesterday and is now slowly beginning to eat again. CHA2DS2VASc is at least 4 (HTN, age+2, female sex).  Home warfarin regimen: Warfarin 3 mg PO Mon, Wed, Fri Warfarin 1.5 mg PO all other days                   Goal of Therapy:  INR 2-3  Date INR Warfarin Dose 9/25 2.9 No home dose 9/26 2.5 3 mg  Plan:  Will give warfarin 3mg  (home dose) in accordance with home regimen Missed dose yesterday but poor oral intake Daily INR CBC at least every 7 days  Will M. Lenon, PharmD, BCPS Clinical Pharmacist 04/21/2024 8:33 AM

## 2024-04-21 NOTE — Assessment & Plan Note (Addendum)
 Suspect peripheral Unable to get MRI to evaluate for central etiology Will trial dexamethasone  given intractable symptoms Continue meclizine , antiemetics Consider ENT consult if no improvement

## 2024-04-22 DIAGNOSIS — R42 Dizziness and giddiness: Secondary | ICD-10-CM | POA: Diagnosis not present

## 2024-04-22 LAB — PROTIME-INR
INR: 2.2 — ABNORMAL HIGH (ref 0.8–1.2)
Prothrombin Time: 25.5 s — ABNORMAL HIGH (ref 11.4–15.2)

## 2024-04-22 MED ORDER — WARFARIN SODIUM 2 MG PO TABS
2.0000 mg | ORAL_TABLET | Freq: Once | ORAL | Status: DC
  Filled 2024-04-22: qty 1

## 2024-04-22 MED ORDER — MECLIZINE HCL 25 MG PO TABS: 25.0000 mg | ORAL_TABLET | Freq: Two times a day (BID) | ORAL | 0 refills | Status: AC | PRN

## 2024-04-22 NOTE — Discharge Instructions (Signed)
 Home Health services set up with [facility]. Start of care will be 24 to 48 hours after discharge.

## 2024-04-22 NOTE — Progress Notes (Signed)
 PHARMACY - ANTICOAGULATION CONSULT NOTE  Pharmacy Consult for Warfarin  Indication: atrial fibrillation  Allergies  Allergen Reactions   Tramadol     Other reaction(s): Other (See Comments) Causes extreme sleepiness, wooziness   Butalbital-Apap-Caff-Cod     Other Reaction(s): Not available   Codeine Nausea Only and Other (See Comments)   Amlodipine Rash   Butalbital-Apap-Caffeine Rash    Other Reaction(s): Other (See Comments)    Patient Measurements: Height: 5' 6 (167.6 cm) Weight: 80 kg (176 lb 5.9 oz) IBW/kg (Calculated) : 59.3 HEPARIN DW (KG): 75.9  Vital Signs: Temp: 98 F (36.7 C) (09/27 0839) BP: 157/96 (09/27 0839) Pulse Rate: 77 (09/27 0839)  Labs: Recent Labs    04/20/24 2212 04/21/24 0555 04/22/24 0634  HGB 12.9  --   --   HCT 39.4  --   --   PLT 380  --   --   LABPROT 31.3* 28.6* 25.5*  INR 2.9* 2.5* 2.2*  CREATININE 0.84  --   --     Estimated Creatinine Clearance: 55.1 mL/min (by C-G formula based on SCr of 0.84 mg/dL).   Medical History: Past Medical History:  Diagnosis Date   Aortic atherosclerosis    Arthritis    Atrial fibrillation (HCC)    a.) CHA2DS2VASc = 5 (age x2, sex, HTN, vascular disease history);  b.) s/p cardiac ablations x 2 (2004 and 2006) - records unavailable; c.) rate/rhythm maintained using carvedilol; chronically anticoagulated with warfarin   Bilateral carpal tunnel syndrome    a.) s/p BILATERAL release   COPD (chronic obstructive pulmonary disease) (HCC)    Eosinophilic asthma    a.) on biologic/mAB therapy (dupilumab)   GERD (gastroesophageal reflux disease)    History of 2019 novel coronavirus disease (COVID-19) 04/2022   History of bilateral cataract extraction    History of kidney stones    Hypertension    Long term current use of anticoagulant    a.) warfarin   Mild cardiomegaly    Neuropathy of both feet    Presence of permanent cardiac pacemaker 2009   a.) s/p placement of [employer] EL  L321/[id number] in 2009 ([place] ); b.) battery changed in 2018 ([place] )   SA node dysfunction (HCC)    a.) s/p dual chamber PPM placement 2009   Severe tricuspid valve insufficiency    a.) s/p TV repair via median sternotomy in ~2005/2006 done in [place]     Medications:  Medications Prior to Admission  Medication Sig Dispense Refill Last Dose/Taking   acetaminophen  (TYLENOL ) 650 MG CR tablet Take 650 mg by mouth every 8 (eight) hours as needed for pain.   Taking As Needed   albuterol  (PROVENTIL ) (2.5 MG/3ML) 0.083% nebulizer solution Take 2.5 mg by nebulization every evening.   04/20/2024   budesonide  (PULMICORT ) 0.5 MG/2ML nebulizer solution Take 0.5 mg by nebulization 2 (two) times daily.   04/20/2024   Calcium Carb-Cholecalciferol 1000-800 MG-UNIT TABS Take 2 tablets by mouth daily.   04/20/2024   carvedilol (COREG) 25 MG tablet Take 1 tablet by mouth 2 (two) times daily.  1 04/20/2024   carvedilol (COREG) 25 MG tablet Take 25 mg by mouth 2 (two) times daily with a meal.   04/20/2024   diltiazem (CARDIZEM CD) 120 MG 24 hr capsule Take 120 mg by mouth daily.   04/20/2024   DULoxetine  (CYMBALTA ) 60 MG capsule Take 60 mg by mouth daily.   04/20/2024   Dupilumab (DUPIXENT) 300 MG/2ML SOPN Inject 300 mg into the skin every 14 (  fourteen) days.   04/07/2024   EPINEPHrine 0.3 mg/0.3 mL IJ SOAJ injection Inject 0.3 mg into the muscle as needed for anaphylaxis.   Taking As Needed   gabapentin  (NEURONTIN ) 600 MG tablet Take 600 mg by mouth at bedtime.   04/20/2024   Ipratropium-Albuterol  (COMBIVENT) 20-100 MCG/ACT AERS respimat Inhale 2 puffs into the lungs every 6 (six) hours as needed.   Taking As Needed   losartan (COZAAR) 50 MG tablet Take 1 tablet by mouth every morning.  1 04/20/2024   losartan (COZAAR) 50 MG tablet Take 50 mg by mouth daily.   Taking   montelukast (SINGULAIR) 10 MG tablet Take 10 mg by mouth at bedtime.   04/20/2024   Multiple Vitamin (MULTIVITAMIN) capsule Take 1 capsule by mouth daily.    04/20/2024   omeprazole (PRILOSEC) 40 MG capsule Take 40 mg by mouth daily.   Taking   Potassium 99 MG TABS Take 1 tablet by mouth 2 (two) times daily.   04/20/2024   predniSONE (DELTASONE) 5 MG tablet Take 5 mg by mouth daily.   04/20/2024   torsemide (DEMADEX) 20 MG tablet Take 20 mg by mouth 2 (two) times daily.   04/20/2024   warfarin (COUMADIN ) 3 MG tablet Take 1 tablet by mouth daily. Take as directed (3 mg  on Wed and Fri then 1.5 mg every other day)  2 04/19/2024   DULoxetine  (CYMBALTA ) 20 MG capsule Take 20 mg by mouth daily. (Patient not taking: Reported on 04/21/2024)   Not Taking   nortriptyline  (PAMELOR ) 10 MG capsule Take 10 mg by mouth at bedtime. (Patient not taking: Reported on 04/21/2024)   Not Taking    Assessment: 83 yo F admitted with dizziness, nausea & vomiting in the setting of vertigo. PMH includes permanent Afib s/p ablationx2, s/p PPM, severe tricuspid valve insufficiency s/p repair, HTN, COPD for which patient takes warfarin. After speaking with patient, her oral intake has been down significantly the past few days due to N&V, however she did miss her dose of warfarin yesterday and is now slowly beginning to eat again. CHA2DS2VASc is at least 4 (HTN, age+2, female sex).  Home warfarin regimen: Warfarin 3 mg PO Mon, Wed, Fri Warfarin 1.5 mg PO all other days                   Goal of Therapy:  INR 2-3  Date INR Warfarin Dose 9/25 2.9 No home dose 9/26 2.5 3 mg 9/27 2.2  Plan:  Will order warfarin 2 mg x 1  Daily INR CBC at least every 7 days  Allean Haas PharmD Clinical Pharmacist 04/22/2024

## 2024-04-22 NOTE — Plan of Care (Signed)

## 2024-04-22 NOTE — TOC Transition Note (Addendum)
 Transition of Care [facility]) - Discharge Note   Patient Details  Name: Kelly Olsen MRN: [id number] Date of Birth: [birth year]  Transition of [facility]) CM/SW Contact:  [place] L Nakari Bracknell, LCSW Phone Number: 04/22/2024, 10:23 AM   Clinical Narrative:     CSW met with patient. No family at bedside. Recommendations for home health discussed. Patient has never had HHA before. Patient agreeable to CSW locating agency that will accept her insurance.   CSW contacted [facility] and spoke with [facility] who confirmed SOC to be 24 to 48 hours after discharge.   Transportation needs discussed. Patient advised that she will call family to request a ride. She does not live near a bus route. CSW will provide a taxi voucher if patient can not get in contact with relatives.   Patient resides alone. She reports having a RW and cane. No new DME recommendations.   TOC signing off.         Patient Goals and CMS Choice            Discharge Placement                       Discharge Plan and Services Additional resources added to the After Visit Summary for                                       Social Drivers of Health (SDOH) Interventions SDOH Screenings   Food Insecurity: No Food Insecurity (04/21/2024)  Housing: Low Risk  (04/21/2024)  Transportation Needs: No Transportation Needs (04/21/2024)  Utilities: Not At Risk (04/21/2024)  Financial Resource Strain: Low Risk  (04/12/2024)   Received from [facility]  Social Connections: Socially Isolated (04/21/2024)  Tobacco Use: Low Risk  (04/20/2024)     Readmission Risk Interventions     No data to display

## 2024-04-22 NOTE — Discharge Summary (Signed)
 Physician Discharge Summary   Patient: Kelly Olsen MRN: [id number] DOB: 1941/05/18  Admit date:     04/20/2024  Discharge date: 04/22/24  Discharge Physician: Leita Blanch   PCP: Cyrus Selinda Moose, PA-C   Recommendations at discharge:    F/u PCP in 1-2 weeks See ENT if s/s do not improve  Discharge Diagnoses: Principal Problem:   Vertigo, intractable Active Problems:   COPD (chronic obstructive pulmonary disease) (HCC)   Permanent atrial fibrillation (HCC)   Long term current use of anticoagulant   SA node dysfunction s/p pacemaker (HCC)   Presence of permanent cardiac pacemaker   H/O tricuspid valve repair   Polymyalgia rheumatica syndrome  Kelly Olsen is a 83 y.o. female with medical history significant for Persistent A-fib s/p ablation x 2 on Coumadin , sinus node dysfunction s/p PPM, HTN, COPD being admitted with intractable vertigo unresolved with treatment in the ED.  Symptoms started around noon on 9/25 when she suddenly developed dizziness with change in position and feeling like the room is spinning and went on to vomit 3 times.   EKG showed a ventricular paced rhythm at 70 CT head nonacute patient treated with meclizine , Zofran  and an NS bolus as well as diazepam  however when ambulation was attempted in preparation for possible discharge, she was unable to tolerate sitting up or standing due to dizziness.  Vertigo/dizziness --Suspect peripheral --Unable to get MRI to evaluate for central etiology--PM+ --Continue meclizine , antiemetics--helped --Consider ENT consult if no improvement   COPD (chronic obstructive pulmonary disease) (HCC) --Not acutely exacerbated --Continue home inhalers with DuoNebs as needed   Permanent atrial fibrillation (HCC) On chronic anticoagulation with warfarin --Continue carvedilol --Continue Coumadin -pharmacy consulted for INR management-patient currently therapeutic   Polymyalgia rheumatica syndrome --On chronic prednisone  therapy 5 mg   H/O tricuspid valve repair --No acute issues --Follows at [facility] cardiology, last seen August 2025   SA node dysfunction s/p pacemaker [facility]) --No acute disease suspected  Patient overall improved. Will discharge to home patient agreeable. Will arrange home health PT    Disposition: Home Diet recommendation:  Discharge Diet Orders (From admission, onward)     Start     Ordered   04/22/24 0000  Diet - low sodium heart healthy        04/22/24 1012           Cardiac and Carb modified diet DISCHARGE MEDICATION: Allergies as of 04/22/2024       Reactions   Tramadol    Other reaction(s): Other (See Comments) Causes extreme sleepiness, wooziness   Butalbital-apap-caff-cod    Other Reaction(s): Not available   Codeine Nausea Only, Other (See Comments)   Amlodipine Rash   Butalbital-apap-caffeine Rash   Other Reaction(s): Other (See Comments)        Medication List     STOP taking these medications    diltiazem 120 MG 24 hr capsule Commonly known as: CARDIZEM CD       TAKE these medications    acetaminophen  650 MG CR tablet Commonly known as: TYLENOL  Take 650 mg by mouth every 8 (eight) hours as needed for pain.   albuterol  (2.5 MG/3ML) 0.083% nebulizer solution Commonly known as: PROVENTIL  Take 2.5 mg by nebulization every evening.   budesonide  0.5 MG/2ML nebulizer solution Commonly known as: PULMICORT  Take 0.5 mg by nebulization 2 (two) times daily.   Calcium Carb-Cholecalciferol 1000-800 MG-UNIT Tabs Take 2 tablets by mouth daily.   carvedilol 25 MG tablet Commonly known as: COREG Take 25 mg by  mouth 2 (two) times daily with a meal. What changed: Another medication with the same name was removed. Continue taking this medication, and follow the directions you see here.   DULoxetine  60 MG capsule Commonly known as: CYMBALTA  Take 60 mg by mouth daily. What changed: Another medication with the same name was removed. Continue taking this  medication, and follow the directions you see here.   Dupixent 300 MG/2ML Soaj Generic drug: Dupilumab Inject 300 mg into the skin every 14 (fourteen) days.   EPINEPHrine 0.3 mg/0.3 mL Soaj injection Commonly known as: EPI-PEN Inject 0.3 mg into the muscle as needed for anaphylaxis.   gabapentin  600 MG tablet Commonly known as: NEURONTIN  Take 600 mg by mouth at bedtime.   Ipratropium-Albuterol  20-100 MCG/ACT Aers respimat Commonly known as: COMBIVENT Inhale 2 puffs into the lungs every 6 (six) hours as needed.   losartan 50 MG tablet Commonly known as: COZAAR Take 50 mg by mouth daily. What changed: Another medication with the same name was removed. Continue taking this medication, and follow the directions you see here.   meclizine  25 MG tablet Commonly known as: ANTIVERT  Take 1 tablet (25 mg total) by mouth 2 (two) times daily as needed for dizziness.   montelukast 10 MG tablet Commonly known as: SINGULAIR Take 10 mg by mouth at bedtime.   multivitamin capsule Take 1 capsule by mouth daily.   nortriptyline  10 MG capsule Commonly known as: PAMELOR  Take 10 mg by mouth at bedtime.   omeprazole 40 MG capsule Commonly known as: PRILOSEC Take 40 mg by mouth daily.   Potassium 99 MG Tabs Take 1 tablet by mouth 2 (two) times daily.   predniSONE 5 MG tablet Commonly known as: DELTASONE Take 5 mg by mouth daily.   torsemide 20 MG tablet Commonly known as: DEMADEX Take 20 mg by mouth 2 (two) times daily.   warfarin 3 MG tablet Commonly known as: COUMADIN  Take 1 tablet by mouth daily. Take as directed (3 mg  on Wed and Fri then 1.5 mg every other day)        Follow-up Information     Cyrus, [employer], PA-C. Schedule an appointment as soon as possible for a visit in 1 week(s).   Specialty: Family Medicine Why: hospital f/u Contact information: [address] [place] [address] [phone]                Discharge Exam: Kelly Olsen    04/20/24 2208  Weight: 80 kg  Alert and oriented x3 Resp--CTA CV--s1s2 normal no murmur Neuro nonfocal   Condition at discharge: fair  The results of significant diagnostics from this hospitalization (including imaging, microbiology, ancillary and laboratory) are listed below for reference.   Imaging Studies: CT Head Wo Contrast Result Date: 04/21/2024 CLINICAL DATA:  Vertigo, central EXAM: CT HEAD WITHOUT CONTRAST TECHNIQUE: Contiguous axial images were obtained from the base of the skull through the vertex without intravenous contrast. RADIATION DOSE REDUCTION: This exam was performed according to the departmental dose-optimization program which includes automated exposure control, adjustment of the mA and/or kV according to patient size and/or use of iterative reconstruction technique. COMPARISON:  04/07/2024 FINDINGS: Brain: No acute intracranial abnormality. Specifically, no hemorrhage, hydrocephalus, mass lesion, acute infarction, or significant intracranial injury. Age related atrophy. Vascular: No hyperdense vessel or unexpected calcification. Skull: No acute calvarial abnormality. Sinuses/Orbits: No acute findings Other: None IMPRESSION: No acute intracranial abnormality. Electronically Signed   By: Franky Crease M.D.   On: 04/21/2024  03:26   CT Head Wo Contrast Result Date: 04/07/2024 CLINICAL DATA:  Provided history: Head trauma, minor. EXAM: CT HEAD WITHOUT CONTRAST TECHNIQUE: Contiguous axial images were obtained from the base of the skull through the vertex without intravenous contrast. RADIATION DOSE REDUCTION: This exam was performed according to the departmental dose-optimization program which includes automated exposure control, adjustment of the mA and/or kV according to patient size and/or use of iterative reconstruction technique. COMPARISON:  None. FINDINGS: Brain: Mild generalized parenchymal atrophy. There is no acute intracranial hemorrhage. No demarcated cortical infarct.  No extra-axial fluid collection. No evidence of an intracranial mass. No midline shift. Vascular: No hyperdense vessel.  Atherosclerotic calcifications. Skull: No calvarial fracture or aggressive osseous lesion. Sinuses/Orbits: No mass or acute finding within the imaged orbits. Minor right ethmoid sinus disease. IMPRESSION: 1. No evidence of an acute intracranial abnormality. 2. Mild generalized parenchymal atrophy. 3. Minor right ethmoid sinus disease. Electronically Signed   By: Rockey Childs D.O.   On: 04/07/2024 13:49   CT Knee Left Wo Contrast Result Date: 04/07/2024 CLINICAL DATA:  Fall, knee trauma EXAM: CT OF THE LEFT KNEE WITHOUT CONTRAST TECHNIQUE: Multidetector CT imaging of the left knee was performed according to the standard protocol. Multiplanar CT image reconstructions were also generated. RADIATION DOSE REDUCTION: This exam was performed according to the departmental dose-optimization program which includes automated exposure control, adjustment of the mA and/or kV according to patient size and/or use of iterative reconstruction technique. COMPARISON:  Radiographs 04/07/2024 FINDINGS: Bones/Joint/Cartilage Prominent osteoarthritis with tricompartmental spurring and articular space narrowing most severely in the lateral compartment. No fracture is identified. Moderate knee joint effusion. Ligaments Suboptimally assessed by CT. Muscles and Tendons Mild regional muscular atrophy. Soft tissues Prepatellar subcutaneous edema. IMPRESSION: 1. No fracture is identified. 2. Prominent osteoarthritis with tricompartmental spurring and articular space narrowing most severely in the lateral compartment. 3. Moderate knee joint effusion. 4. Prepatellar subcutaneous edema. Electronically Signed   By: Ryan Salvage M.D.   On: 04/07/2024 13:33   DG Hip Unilat W or Wo Pelvis 2-3 Views Left Result Date: 04/07/2024 CLINICAL DATA:  Two falls yesterday. EXAM: DG HIP (WITH OR WITHOUT PELVIS) 2-3V LEFT COMPARISON:   CT abdomen/pelvis dated 04/10/2022. FINDINGS: No acute fracture or dislocation. Bilateral hip arthroplasties with normal alignment. Sacroiliac joints and pubic symphysis are anatomically aligned. IMPRESSION: No acute osseous abnormality. Electronically Signed   By: Harrietta Sherry M.D.   On: 04/07/2024 11:27   DG Knee Complete 4 Views Left Result Date: 04/07/2024 CLINICAL DATA:  Two falls yesterday. EXAM: LEFT KNEE - COMPLETE 4+ VIEW COMPARISON:  None Available. FINDINGS: No acute fracture or dislocation. Moderate tricompartmental osteoarthritis with joint space narrowing and marginal osteophytosis. Small knee joint effusion. Soft tissues are unremarkable. IMPRESSION: 1. No acute osseous abnormality. 2. 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 Electronically Signed   By: Harrietta Sherry M.D.   On: 04/07/2024 11:25   DG Shoulder Left Result Date: 04/07/2024 CLINICAL DATA:  Two falls yesterday. EXAM: LEFT SHOULDER - 2+ VIEW COMPARISON:  None Available. FINDINGS: No acute fracture or dislocation of the left shoulder. Mild glenohumeral and acromioclavicular joint osteoarthritis. Partially visualized left-sided pacemaker. Redemonstrated chronic discontinuity of superior most median sternotomy wire. IMPRESSION: 1. No acute fracture or dislocation of the left shoulder. 2.  Mild glenohumeral and acromioclavicular joint osteoarthritis. Electronically Signed   By: Harrietta Sherry M.D.   On: 04/07/2024 11:23    Microbiology: Results for orders placed or performed in  visit on 03/25/22  Microscopic Examination     Status: Abnormal   Collection Time: 03/25/22 10:40 AM   Urine  Result Value Ref Range Status   WBC, UA >30 (A) 0 - 5 /hpf Final   RBC, Urine 3-10 (A) 0 - 2 /hpf Final   Epithelial Cells (non renal) 0-10 0 - 10 /hpf Final   Bacteria, UA Moderate (A) None seen/Few Final  CULTURE, URINE COMPREHENSIVE     Status: Abnormal   Collection Time: 03/25/22 11:13  AM   Specimen: Urine   UR  Result Value Ref Range Status   Urine Culture, Comprehensive Final report (A)  Final   Organism ID, Bacteria Enterococcus faecalis (A)  Final    Comment: For Enterococcus species, aminoglycosides (except for high-level resistance screening), cephalosporins, clindamycin, and trimethoprim -sulfamethoxazole  are not effective clinically. (CLSI, M100-S26, 2016) Greater than 100,000 colony forming units per mL    Organism ID, Bacteria Comment  Final    Comment: Mixed urogenital flora Less than 10,000 colonies/mL    ANTIMICROBIAL SUSCEPTIBILITY Comment  Final    Comment:       ** S = Susceptible; I = Intermediate; R = Resistant **                    P = Positive; N = Negative             MICS are expressed in micrograms per mL    Antibiotic                 RSLT#1    RSLT#2    RSLT#3    RSLT#4 Ciprofloxacin                  S Levofloxacin                   S Nitrofurantoin                 S Penicillin                     S Tetracycline                   R Vancomycin                     S     Labs: CBC: Recent Labs  Lab 04/20/24 2212  WBC 11.1*  HGB 12.9  HCT 39.4  MCV 90.6  PLT 380   Basic Metabolic Panel: Recent Labs  Lab 04/20/24 2212  NA 142  K 4.2  CL 100  CO2 26  GLUCOSE 109*  BUN 13  CREATININE 0.84  CALCIUM 9.6   Liver Function Tests: Recent Labs  Lab 04/20/24 2212  AST 38  ALT 21  ALKPHOS 60  BILITOT 0.9  PROT 7.5  ALBUMIN 3.4*   CBG: Recent Labs  Lab 04/20/24 2234 04/21/24 0501  GLUCAP 100* 87    Discharge time spent: greater than 30 minutes.  Signed: Leita Blanch, MD [facility] 04/22/2024

## 2024-04-24 DIAGNOSIS — M353 Polymyalgia rheumatica: Secondary | ICD-10-CM | POA: Diagnosis not present

## 2024-04-24 DIAGNOSIS — G629 Polyneuropathy, unspecified: Secondary | ICD-10-CM | POA: Diagnosis not present

## 2024-04-24 DIAGNOSIS — I4821 Permanent atrial fibrillation: Secondary | ICD-10-CM | POA: Diagnosis not present

## 2024-04-24 DIAGNOSIS — I700 Atherosclerosis of aorta: Secondary | ICD-10-CM | POA: Diagnosis not present

## 2024-04-24 DIAGNOSIS — J4489 Other specified chronic obstructive pulmonary disease: Secondary | ICD-10-CM | POA: Diagnosis not present

## 2024-04-24 DIAGNOSIS — I495 Sick sinus syndrome: Secondary | ICD-10-CM | POA: Diagnosis not present

## 2024-04-24 DIAGNOSIS — I10 Essential (primary) hypertension: Secondary | ICD-10-CM | POA: Diagnosis not present

## 2024-04-24 DIAGNOSIS — J8283 Eosinophilic asthma: Secondary | ICD-10-CM | POA: Diagnosis not present

## 2024-04-24 DIAGNOSIS — G319 Degenerative disease of nervous system, unspecified: Secondary | ICD-10-CM | POA: Diagnosis not present

## 2024-04-27 DIAGNOSIS — I700 Atherosclerosis of aorta: Secondary | ICD-10-CM | POA: Diagnosis not present

## 2024-05-03 ENCOUNTER — Ambulatory Visit
Admission: RE | Admit: 2024-05-03 | Discharge: 2024-05-03 | Disposition: A | Discharge status: Home / Self Care | Source: Ambulatory Visit | Attending: Urology | Admitting: Urology

## 2024-05-03 ENCOUNTER — Ambulatory Visit
Admission: RE | Admit: 2024-05-03 | Discharge: 2024-05-03 | Disposition: A | Discharge status: Home / Self Care | Attending: Urology | Admitting: Urology

## 2024-05-03 ENCOUNTER — Ambulatory Visit: Admitting: Urology

## 2024-05-03 VITALS — BP 112/59 | HR 82 | Ht 66.0 in | Wt 200.0 lb

## 2024-05-03 DIAGNOSIS — N200 Calculus of kidney: Secondary | ICD-10-CM | POA: Insufficient documentation

## 2024-05-03 DIAGNOSIS — R3129 Other microscopic hematuria: Secondary | ICD-10-CM

## 2024-05-03 LAB — MICROSCOPIC EXAMINATION

## 2024-05-03 LAB — URINALYSIS, COMPLETE
Bilirubin, UA: NEGATIVE
Glucose, UA: NEGATIVE
Ketones, UA: NEGATIVE
Leukocytes,UA: NEGATIVE
Nitrite, UA: NEGATIVE
Protein,UA: NEGATIVE
Specific Gravity, UA: 1.01 (ref 1.005–1.030)
Urobilinogen, Ur: 0.2 mg/dL (ref 0.2–1.0)
pH, UA: 6 (ref 5.0–7.5)

## 2024-05-03 NOTE — Progress Notes (Unsigned)
 05/03/2024 2:12 PM   [name] 1941-06-11 [id number]  Referring provider: Cyrus Selinda Moose, PA-C [address] [place],  [place] [address]  Chief Complaint  Patient presents with   Nephrolithiasis   Urologic History:   1. Nephrolithiasis CT abdomen pelvis 04/10/2022 with a 7 mm right lower pole calculus and 4 mm left lower pole calculus.  HPI: Kelly Olsen is a 83 y.o. female  52-month history of urinary frequency, urgency and urge incontinence No dysuria, gross hematuria No flank, abdominal or pelvic pain   PMH: Past Medical History:  Diagnosis Date   Aortic atherosclerosis    Arthritis    Atrial fibrillation (HCC)    a.) CHA2DS2VASc = 5 (age x2, sex, HTN, vascular disease history);  b.) s/p cardiac ablations x 2 (2004 and 2006) - records unavailable; c.) rate/rhythm maintained using carvedilol; chronically anticoagulated with warfarin   Bilateral carpal tunnel syndrome    a.) s/p BILATERAL release   COPD (chronic obstructive pulmonary disease) (HCC)    Eosinophilic asthma    a.) on biologic/mAB therapy (dupilumab)   GERD (gastroesophageal reflux disease)    History of 2019 novel coronavirus disease (COVID-19) 04/2022   History of bilateral cataract extraction    History of kidney stones    Hypertension    Long term current use of anticoagulant    a.) warfarin   Mild cardiomegaly    Neuropathy of both feet    Presence of permanent cardiac pacemaker 2009   a.) s/p placement of [employer] EL [id number] in 2009 ([place] ); b.) battery changed in 2018 ([place] )   SA node dysfunction (HCC)    a.) s/p dual chamber PPM placement 2009   Severe tricuspid valve insufficiency    a.) s/p TV repair via median sternotomy in ~2005/2006 done in [place]     Surgical History: Past Surgical History:  Procedure Laterality Date   BILATERAL CARPAL TUNNEL RELEASE Bilateral    CARDIAC ELECTROPHYSIOLOGY STUDY AND ABLATION N/A 2004   CARDIAC  ELECTROPHYSIOLOGY STUDY AND ABLATION N/A 2006   CARDIAC VALVE SURGERY N/A    Procedure: TRICUSPID VALVE REPAIR; Location: [place]    CARPAL TUNNEL RELEASE Right 02/10/2023   Procedure: REVISION OPEN CARPAL TUNNEL RELEASE;  Surgeon: Kelly Norleen PARAS, MD;  Location: [facility];  Service: Orthopedics;  Laterality: Right;  2nd case   CARPAL TUNNEL RELEASE Left 03/31/2023   Procedure: OPEN CARPAL TUNNEL RELEASE;  Surgeon: Kelly Norleen PARAS, MD;  Location: [facility];  Service: Orthopedics;  Laterality: Left;  2nd case   CATARACT EXTRACTION, BILATERAL     CHOLECYSTECTOMY     PACEMAKER BATTERY CHANGE N/A 2018   Procedure: PACEMAKER BATTERY CHANGE; Location: [place]    PACEMAKER INSERTION N/A 2009   Procedure: PACEMAKER INSERTION; Location: [place]    TOTAL HIP ARTHROPLASTY Bilateral    TOTAL KNEE ARTHROPLASTY Right     Home Medications:  Allergies as of 05/03/2024       Reactions   Tramadol    Other reaction(s): Other (See Comments) Causes extreme sleepiness, wooziness   Butalbital-apap-caff-cod    Other Reaction(s): Not available   Codeine Nausea Only, Other (See Comments)   Amlodipine Rash   Butalbital-apap-caffeine Rash   Other Reaction(s): Other (See Comments)        Medication List        Accurate as of May 03, 2024  2:12 PM. If you have any questions, ask your nurse or doctor.          acetaminophen  650 MG CR tablet Commonly  known as: TYLENOL  Take 650 mg by mouth every 8 (eight) hours as needed for pain.   albuterol  (2.5 MG/3ML) 0.083% nebulizer solution Commonly known as: PROVENTIL  Take 2.5 mg by nebulization every evening.   budesonide  0.5 MG/2ML nebulizer solution Commonly known as: PULMICORT  Take 0.5 mg by nebulization 2 (two) times daily.   Calcium Carb-Cholecalciferol 1000-800 MG-UNIT Tabs Take 2 tablets by mouth daily.   carvedilol 25 MG tablet Commonly known as: COREG Take 25 mg by mouth 2 (two) times daily with a meal.   DULoxetine  60 MG capsule Commonly  known as: CYMBALTA  Take 60 mg by mouth daily.   Dupixent 300 MG/2ML Soaj Generic drug: Dupilumab Inject 300 mg into the skin every 14 (fourteen) days.   EPINEPHrine 0.3 mg/0.3 mL Soaj injection Commonly known as: EPI-PEN Inject 0.3 mg into the muscle as needed for anaphylaxis.   gabapentin  600 MG tablet Commonly known as: NEURONTIN  Take 600 mg by mouth at bedtime.   Ipratropium-Albuterol  20-100 MCG/ACT Aers respimat Commonly known as: COMBIVENT Inhale 2 puffs into the lungs every 6 (six) hours as needed.   losartan 50 MG tablet Commonly known as: COZAAR Take 50 mg by mouth daily.   meclizine  25 MG tablet Commonly known as: ANTIVERT  Take 1 tablet (25 mg total) by mouth 2 (two) times daily as needed for dizziness.   montelukast 10 MG tablet Commonly known as: SINGULAIR Take 10 mg by mouth at bedtime.   multivitamin capsule Take 1 capsule by mouth daily.   nortriptyline  10 MG capsule Commonly known as: PAMELOR  Take 10 mg by mouth at bedtime.   omeprazole 40 MG capsule Commonly known as: PRILOSEC Take 40 mg by mouth daily.   Potassium 99 MG Tabs Take 1 tablet by mouth 2 (two) times daily.   predniSONE 5 MG tablet Commonly known as: DELTASONE Take 5 mg by mouth daily.   torsemide 20 MG tablet Commonly known as: DEMADEX Take 20 mg by mouth 2 (two) times daily.   warfarin 3 MG tablet Commonly known as: COUMADIN  Take 1 tablet by mouth daily. Take as directed (3 mg  on Wed and Fri then 1.5 mg every other day)        Allergies:  Allergies  Allergen Reactions   Tramadol     Other reaction(s): Other (See Comments) Causes extreme sleepiness, wooziness   Butalbital-Apap-Caff-Cod     Other Reaction(s): Not available   Codeine Nausea Only and Other (See Comments)   Amlodipine Rash   Butalbital-Apap-Caffeine Rash    Other Reaction(s): Other (See Comments)    Family History: No family history on file.  Social History:  reports that she has never smoked.  She has never used smokeless tobacco. She reports that she does not drink alcohol and does not use drugs.   Physical Exam: BP (!) 112/59   Pulse 82   Ht 5' 6 (1.676 m)   Wt 200 lb (90.7 kg)   BMI 32.28 kg/m   Constitutional:  Alert, No acute distress. HEENT: [place] AT Respiratory: Normal respiratory effort, no increased work of breathing. Psychiatric: Normal mood and affect.   Pertinent Imaging: KUB performed earlier today was personally reviewed and interpreted.  Stable right upper/left lower pole renal calculi  Assessment & Plan:    1.  Bilateral nephrolithiasis Stable  2.  Lower urinary tract symptoms Most likely overactive bladder with urge incontinence UA ordered; if UA negative we discussed medical management however she does not desire additional medications at this time She would  be interested in pelvic floor physical therapy  Addendum: Urinalysis showed 3-10 RBC on microscopy.  May be secondary to her urinary calculi however recommend further evaluation with CT urogram and cystoscopy.  Patient will be contacted with UA results and the above recommendations recommendations    Glendia JAYSON Barba, MD  [facility] [address], [address] [place], [place] [address] [phone]

## 2024-05-04 ENCOUNTER — Ambulatory Visit: Payer: Self-pay | Admitting: Urology

## 2024-05-04 ENCOUNTER — Encounter: Payer: Self-pay | Admitting: Urology

## 2024-05-04 ENCOUNTER — Other Ambulatory Visit: Payer: Self-pay | Admitting: *Deleted

## 2024-05-04 DIAGNOSIS — J8283 Eosinophilic asthma: Secondary | ICD-10-CM | POA: Diagnosis not present

## 2024-05-04 DIAGNOSIS — I700 Atherosclerosis of aorta: Secondary | ICD-10-CM | POA: Diagnosis not present

## 2024-05-04 DIAGNOSIS — J4489 Other specified chronic obstructive pulmonary disease: Secondary | ICD-10-CM | POA: Diagnosis not present

## 2024-05-04 DIAGNOSIS — I4821 Permanent atrial fibrillation: Secondary | ICD-10-CM | POA: Diagnosis not present

## 2024-05-04 DIAGNOSIS — R3129 Other microscopic hematuria: Secondary | ICD-10-CM

## 2024-05-04 DIAGNOSIS — I495 Sick sinus syndrome: Secondary | ICD-10-CM | POA: Diagnosis not present

## 2024-05-04 DIAGNOSIS — M353 Polymyalgia rheumatica: Secondary | ICD-10-CM | POA: Diagnosis not present

## 2024-05-04 DIAGNOSIS — I10 Essential (primary) hypertension: Secondary | ICD-10-CM | POA: Diagnosis not present

## 2024-05-04 NOTE — Progress Notes (Signed)
 Referring Physician:  Cyrus Selinda Moose, PA-C [address] [place],  [place] [address]  Primary Physician:  Kelly Selinda Moose, PA-C  History of Present Illness: 05/11/2024 Kelly Olsen is here today with a chief complaint of worsening back pain and limited mobility.  Back pain has progressively worsened over several years, primarily located in the mid-back along the waistline. Pain is exacerbated by standing and walking, significantly limiting her mobility. She had difficulty walking from her car to the hospital due to the pain.  Neuropathy presents as numbness and tingling in her feet, with pain radiating up her calves. She is on medication for neuropathy, though specifics are not provided. No leg or buttock pain is reported, but walking is difficult due to back pain.  Arthritis affects her back, and previous therapies have not provided significant relief. She has undergone multiple surgeries, including both hips, one knee, and bilateral carpal tunnel surgeries, but none on her back.  She lives alone and manages daily activities independently, though back pain limits her ability to perform desired activities. A pacemaker, updated with a new battery four years ago, has MRI-incompatible leads, affecting imaging options.  Bowel/Bladder Dysfunction: none  Conservative measures:  Physical therapy:  has participated in at [facility] Multimodal medical therapy including regular antiinflammatories:  tylenol , cymbalta , gabapentin , tramadol  Injections:   01/07/2024: Right L5-L6 and right L6-S1 transforaminal ESI (no relief)   Past Surgery: no spinal surgeries   Kelly Olsen has no symptoms of cervical myelopathy.  The symptoms are causing a significant impact on the patient's life.   I have utilized the care everywhere function in epic to review the outside records available from external health systems.   Review of Systems:  A 10 point review of  systems is negative, except for the pertinent positives and negatives detailed in the HPI.  Past Medical History: Past Medical History:  Diagnosis Date   Aortic atherosclerosis    Arthritis    Atrial fibrillation (HCC)    a.) CHA2DS2VASc = 5 (age x2, sex, HTN, vascular disease history);  b.) s/p cardiac ablations x 2 (2004 and 2006) - records unavailable; c.) rate/rhythm maintained using carvedilol; chronically anticoagulated with warfarin   Bilateral carpal tunnel syndrome    a.) s/p BILATERAL release   COPD (chronic obstructive pulmonary disease) (HCC)    Eosinophilic asthma    a.) on biologic/mAB therapy (dupilumab)   GERD (gastroesophageal reflux disease)    History of 2019 novel coronavirus disease (COVID-19) 04/2022   History of bilateral cataract extraction    History of kidney stones    Hypertension    Long term current use of anticoagulant    a.) warfarin   Mild cardiomegaly    Neuropathy of both feet    Presence of permanent cardiac pacemaker 2009   a.) s/p placement of [employer] EL [id number]/[id number] in 2009 ([place] ); b.) battery changed in 2018 ([place] )   SA node dysfunction (HCC)    a.) s/p dual chamber PPM placement 2009   Severe tricuspid valve insufficiency    a.) s/p TV repair via median sternotomy in ~2005/2006 done in [place]     Past Surgical History: Past Surgical History:  Procedure Laterality Date   BILATERAL CARPAL TUNNEL RELEASE Bilateral    CARDIAC ELECTROPHYSIOLOGY STUDY AND ABLATION N/A 2004   CARDIAC ELECTROPHYSIOLOGY STUDY AND ABLATION N/A 2006   CARDIAC VALVE SURGERY N/A    Procedure: TRICUSPID VALVE REPAIR; Location: [place]    CARPAL TUNNEL RELEASE Right 02/10/2023  Procedure: REVISION OPEN CARPAL TUNNEL RELEASE;  Surgeon: Kelly Norleen PARAS, MD;  Location: [facility];  Service: Orthopedics;  Laterality: Right;  2nd case   CARPAL TUNNEL RELEASE Left 03/31/2023   Procedure: OPEN CARPAL TUNNEL RELEASE;  Surgeon: Kelly Norleen PARAS, MD;  Location:  [facility];  Service: Orthopedics;  Laterality: Left;  2nd case   CATARACT EXTRACTION, BILATERAL     CHOLECYSTECTOMY     PACEMAKER BATTERY CHANGE N/A 2018   Procedure: PACEMAKER BATTERY CHANGE; Location: [place]    PACEMAKER INSERTION N/A 2009   Procedure: PACEMAKER INSERTION; Location: [place]    TOTAL HIP ARTHROPLASTY Bilateral    TOTAL KNEE ARTHROPLASTY Right     Allergies: Allergies as of 05/11/2024 - Review Complete 05/04/2024  Allergen Reaction Noted   Tramadol  02/26/2022   Butalbital-apap-caff-cod  04/15/2023   Codeine Nausea Only and Other (See Comments) 02/07/2015   Amlodipine Rash 07/25/2018   Butalbital-apap-caffeine Rash 07/09/2022    Medications:  Current Outpatient Medications:    acetaminophen  (TYLENOL ) 650 MG CR tablet, Take 650 mg by mouth every 8 (eight) hours as needed for pain., Disp: , Rfl:    albuterol  (PROVENTIL ) (2.5 MG/3ML) 0.083% nebulizer solution, Take 2.5 mg by nebulization every evening., Disp: , Rfl:    budesonide  (PULMICORT ) 0.5 MG/2ML nebulizer solution, Take 0.5 mg by nebulization 2 (two) times daily., Disp: , Rfl:    Calcium Carb-Cholecalciferol 1000-800 MG-UNIT TABS, Take 2 tablets by mouth daily., Disp: , Rfl:    carvedilol (COREG) 25 MG tablet, Take 25 mg by mouth 2 (two) times daily with a meal., Disp: , Rfl:    DULoxetine  (CYMBALTA ) 60 MG capsule, Take 60 mg by mouth daily., Disp: , Rfl:    Dupilumab (DUPIXENT) 300 MG/2ML SOPN, Inject 300 mg into the skin every 14 (fourteen) days., Disp: , Rfl:    EPINEPHrine 0.3 mg/0.3 mL IJ SOAJ injection, Inject 0.3 mg into the muscle as needed for anaphylaxis., Disp: , Rfl:    gabapentin  (NEURONTIN ) 600 MG tablet, Take 600 mg by mouth at bedtime., Disp: , Rfl:    Ipratropium-Albuterol  (COMBIVENT) 20-100 MCG/ACT AERS respimat, Inhale 2 puffs into the lungs every 6 (six) hours as needed., Disp: , Rfl:    losartan (COZAAR) 50 MG tablet, Take 50 mg by mouth daily., Disp: , Rfl:    meclizine  (ANTIVERT ) 25 MG  tablet, Take 1 tablet (25 mg total) by mouth 2 (two) times daily as needed for dizziness., Disp: 20 tablet, Rfl: 0   montelukast (SINGULAIR) 10 MG tablet, Take 10 mg by mouth at bedtime., Disp: , Rfl:    Multiple Vitamin (MULTIVITAMIN) capsule, Take 1 capsule by mouth daily., Disp: , Rfl:    nortriptyline  (PAMELOR ) 10 MG capsule, Take 10 mg by mouth at bedtime. (Patient not taking: Reported on 05/03/2024), Disp: , Rfl:    omeprazole (PRILOSEC) 40 MG capsule, Take 40 mg by mouth daily., Disp: , Rfl:    Potassium 99 MG TABS, Take 1 tablet by mouth 2 (two) times daily., Disp: , Rfl:    predniSONE (DELTASONE) 5 MG tablet, Take 5 mg by mouth daily., Disp: , Rfl:    torsemide (DEMADEX) 20 MG tablet, Take 20 mg by mouth 2 (two) times daily., Disp: , Rfl:    warfarin (COUMADIN ) 3 MG tablet, Take 1 tablet by mouth daily. Take as directed (3 mg  on Wed and Fri then 1.5 mg every other day), Disp: , Rfl: 2  Social History: Social History   Tobacco Use   Smoking status:  Never   Smokeless tobacco: Never  Vaping Use   Vaping status: Never Used  Substance Use Topics   Alcohol use: No   Drug use: Never    Family Medical History: No family history on file.  Physical Examination: There were no vitals filed for this visit.  General: Patient is in no apparent distress. Attention to examination is appropriate.  Neck:   Supple.  Full range of motion.  Respiratory: Patient is breathing without any difficulty.   NEUROLOGICAL:     Awake, alert, oriented to person, place, and time.  Speech is clear and fluent.   Cranial Nerves: Pupils equal round and reactive to light.  Facial tone is symmetric.  Facial sensation is symmetric. Shoulder shrug is symmetric. Tongue protrusion is midline.  There is no pronator drift.  Strength: Side Biceps Triceps Deltoid Interossei Grip Wrist Ext. Wrist Flex.  R 5 5 5 5 5 5 5   L 5 5 5 5 5 5 5    Side Iliopsoas Quads Hamstring PF DF EHL  R 5 5 5 5 5 5   L 5 5 5 5 5 5     Reflexes are 1+ and symmetric at the biceps, triceps, brachioradialis, patella and achilles.   Hoffman's is absent.   Bilateral upper and lower extremity sensation is intact to light touch.    No evidence of dysmetria noted.  Gait is normal.     Medical Decision Making  Imaging: CT L spine 12/08/2023 IMPRESSION: Degenerative changes of the lumbar spine as above. 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   Additional moderate spinal canal stenosis at L5-6.   Multilevel foraminal stenosis, greatest and severe on the right at L3-4. Additional moderate foraminal stenosis bilaterally at L4-5 and on the left at L5-6.   Transitional anatomy as noted above with 6 non-rib-bearing lumbar type vertebra. The lowest well-formed disc space is labeled L6-S1. Recommend radiographic correlation prior to any potential surgical intervention.   Nonobstructing nephrolithiasis in the left kidney measuring up to 0.4 cm. Parenchymal calcification in the posterior right kidney. Additional slightly hyperattenuating focus within the anterior aspect of the left kidney, recommend renal ultrasound for further evaluation.     Electronically Signed   By: Donnice Mania M.D.   On: 12/24/2023 21:48  I have personally reviewed the images and agree with the above interpretation.  Assessment and Plan: Kelly Olsen is a pleasant 83 y.o. female with low back pain without sciatica as well as peripheral neuropathy.  This is currently impairing her life.  At this point, I do not feel that a large spinal operation is in her best interest.  We discussed the options including consideration of spinal cord stimulation.  She would like to consider this to address her peripheral neuropathy and back pain.      Thank you for involving me in the care of this patient.      Rupal Childress K. Clois MD, [facility] Neurosurgery

## 2024-05-05 ENCOUNTER — Inpatient Hospital Stay
Admission: RE | Admit: 2024-05-05 | Discharge: 2024-05-05 | Disposition: A | Discharge status: Home / Self Care | Payer: Self-pay | Source: Ambulatory Visit | Attending: Neurosurgery | Admitting: Neurosurgery

## 2024-05-05 ENCOUNTER — Other Ambulatory Visit: Payer: Self-pay | Admitting: Family Medicine

## 2024-05-05 DIAGNOSIS — Z049 Encounter for examination and observation for unspecified reason: Secondary | ICD-10-CM

## 2024-05-09 DIAGNOSIS — M5136 Other intervertebral disc degeneration, lumbar region with discogenic back pain only: Secondary | ICD-10-CM | POA: Diagnosis not present

## 2024-05-09 DIAGNOSIS — Z7901 Long term (current) use of anticoagulants: Secondary | ICD-10-CM | POA: Diagnosis not present

## 2024-05-09 DIAGNOSIS — M150 Primary generalized (osteo)arthritis: Secondary | ICD-10-CM | POA: Diagnosis not present

## 2024-05-09 DIAGNOSIS — M353 Polymyalgia rheumatica: Secondary | ICD-10-CM | POA: Diagnosis not present

## 2024-05-09 DIAGNOSIS — I071 Rheumatic tricuspid insufficiency: Secondary | ICD-10-CM | POA: Diagnosis not present

## 2024-05-09 DIAGNOSIS — I10 Essential (primary) hypertension: Secondary | ICD-10-CM | POA: Diagnosis not present

## 2024-05-09 DIAGNOSIS — I4819 Other persistent atrial fibrillation: Secondary | ICD-10-CM | POA: Diagnosis not present

## 2024-05-09 DIAGNOSIS — M810 Age-related osteoporosis without current pathological fracture: Secondary | ICD-10-CM | POA: Diagnosis not present

## 2024-05-09 DIAGNOSIS — Z9889 Other specified postprocedural states: Secondary | ICD-10-CM | POA: Diagnosis not present

## 2024-05-09 DIAGNOSIS — I495 Sick sinus syndrome: Secondary | ICD-10-CM | POA: Diagnosis not present

## 2024-05-09 DIAGNOSIS — Z7960 Long term (current) use of unspecified immunomodulators and immunosuppressants: Secondary | ICD-10-CM | POA: Diagnosis not present

## 2024-05-09 DIAGNOSIS — R791 Abnormal coagulation profile: Secondary | ICD-10-CM | POA: Diagnosis not present

## 2024-05-11 ENCOUNTER — Ambulatory Visit: Admitting: Neurosurgery

## 2024-05-11 ENCOUNTER — Ambulatory Visit

## 2024-05-11 VITALS — BP 128/62 | Ht 67.0 in | Wt 203.1 lb

## 2024-05-11 DIAGNOSIS — M545 Low back pain, unspecified: Secondary | ICD-10-CM

## 2024-05-11 DIAGNOSIS — M47816 Spondylosis without myelopathy or radiculopathy, lumbar region: Secondary | ICD-10-CM | POA: Diagnosis not present

## 2024-05-11 DIAGNOSIS — G629 Polyneuropathy, unspecified: Secondary | ICD-10-CM | POA: Diagnosis not present

## 2024-05-11 DIAGNOSIS — G8929 Other chronic pain: Secondary | ICD-10-CM

## 2024-05-11 DIAGNOSIS — M5126 Other intervertebral disc displacement, lumbar region: Secondary | ICD-10-CM | POA: Diagnosis not present

## 2024-05-11 DIAGNOSIS — M79606 Pain in leg, unspecified: Secondary | ICD-10-CM | POA: Diagnosis not present

## 2024-05-11 NOTE — Patient Instructions (Signed)
 [facility] - televisits [phone] Not in network with [facility] 20[facility] - televisits [phone] Not in network with [facility] 2024 [facility] in [place] Provider: Duwaine [place], [place] [phone] Accepts [facility] Dr Corina in [place] [phone] Accepts [facility], but is typically booked out about 10 months Dr Achilles in [place] [phone]  [facility] (offices in [place], [place], and [place]) [phone]   Please let us  know which one of the above psychologist's you would like to see for evaluation prior to the spinal cord stimulator trial. These are the only providers we are aware of that perform this type of evaluation. Once we fax the referral, please call them to set up an appointment (they do not typically call you).

## 2024-05-16 DIAGNOSIS — J4540 Moderate persistent asthma, uncomplicated: Secondary | ICD-10-CM | POA: Diagnosis not present

## 2024-05-17 DIAGNOSIS — F4542 Pain disorder with related psychological factors: Secondary | ICD-10-CM | POA: Diagnosis not present

## 2024-05-24 ENCOUNTER — Other Ambulatory Visit: Payer: Self-pay | Admitting: Radiology

## 2024-05-24 DIAGNOSIS — R609 Edema, unspecified: Secondary | ICD-10-CM | POA: Diagnosis not present

## 2024-05-24 DIAGNOSIS — M353 Polymyalgia rheumatica: Secondary | ICD-10-CM | POA: Diagnosis not present

## 2024-05-24 DIAGNOSIS — J8283 Eosinophilic asthma: Secondary | ICD-10-CM | POA: Insufficient documentation

## 2024-05-24 DIAGNOSIS — Z79899 Other long term (current) drug therapy: Secondary | ICD-10-CM | POA: Diagnosis not present

## 2024-05-24 DIAGNOSIS — T7840XA Allergy, unspecified, initial encounter: Secondary | ICD-10-CM | POA: Insufficient documentation

## 2024-05-24 DIAGNOSIS — Z23 Encounter for immunization: Secondary | ICD-10-CM | POA: Diagnosis not present

## 2024-05-24 NOTE — Progress Notes (Signed)
 Patient for CT Thoracic & Lumbar Myelogram on Thurs 05/25/24, I called and spoke with the patient on the phone and gave pre-procedure instructions. Pt was made aware to be here at 9:30a and check in at the H&V entrance. Pt stated understanding. Called 05/16/24

## 2024-05-25 ENCOUNTER — Ambulatory Visit
Admission: RE | Admit: 2024-05-25 | Discharge: 2024-05-25 | Disposition: A | Discharge status: Home / Self Care | Source: Ambulatory Visit | Attending: Neurosurgery | Admitting: Neurosurgery

## 2024-05-25 ENCOUNTER — Other Ambulatory Visit: Payer: Self-pay

## 2024-05-25 DIAGNOSIS — M4185 Other forms of scoliosis, thoracolumbar region: Secondary | ICD-10-CM | POA: Diagnosis not present

## 2024-05-25 DIAGNOSIS — M4802 Spinal stenosis, cervical region: Secondary | ICD-10-CM | POA: Insufficient documentation

## 2024-05-25 DIAGNOSIS — M4804 Spinal stenosis, thoracic region: Secondary | ICD-10-CM | POA: Diagnosis not present

## 2024-05-25 DIAGNOSIS — G629 Polyneuropathy, unspecified: Secondary | ICD-10-CM

## 2024-05-25 DIAGNOSIS — M47816 Spondylosis without myelopathy or radiculopathy, lumbar region: Secondary | ICD-10-CM | POA: Diagnosis not present

## 2024-05-25 DIAGNOSIS — M79604 Pain in right leg: Secondary | ICD-10-CM | POA: Insufficient documentation

## 2024-05-25 DIAGNOSIS — M438X6 Other specified deforming dorsopathies, lumbar region: Secondary | ICD-10-CM | POA: Diagnosis not present

## 2024-05-25 DIAGNOSIS — M48061 Spinal stenosis, lumbar region without neurogenic claudication: Secondary | ICD-10-CM | POA: Diagnosis not present

## 2024-05-25 DIAGNOSIS — M50322 Other cervical disc degeneration at C5-C6 level: Secondary | ICD-10-CM | POA: Insufficient documentation

## 2024-05-25 DIAGNOSIS — G8929 Other chronic pain: Secondary | ICD-10-CM | POA: Insufficient documentation

## 2024-05-25 DIAGNOSIS — M5136 Other intervertebral disc degeneration, lumbar region with discogenic back pain only: Secondary | ICD-10-CM | POA: Diagnosis not present

## 2024-05-25 DIAGNOSIS — M5134 Other intervertebral disc degeneration, thoracic region: Secondary | ICD-10-CM | POA: Insufficient documentation

## 2024-05-25 DIAGNOSIS — M4184 Other forms of scoliosis, thoracic region: Secondary | ICD-10-CM | POA: Diagnosis not present

## 2024-05-25 DIAGNOSIS — M545 Low back pain, unspecified: Secondary | ICD-10-CM | POA: Diagnosis not present

## 2024-05-25 DIAGNOSIS — M549 Dorsalgia, unspecified: Secondary | ICD-10-CM | POA: Diagnosis not present

## 2024-05-25 DIAGNOSIS — M5126 Other intervertebral disc displacement, lumbar region: Secondary | ICD-10-CM | POA: Diagnosis not present

## 2024-05-25 DIAGNOSIS — M5135 Other intervertebral disc degeneration, thoracolumbar region: Secondary | ICD-10-CM | POA: Diagnosis not present

## 2024-05-25 MED ORDER — IOHEXOL 300 MG/ML  SOLN
10.0000 mL | Freq: Once | INTRAMUSCULAR | Status: AC | PRN
  Administered 2024-05-25: 10 mL

## 2024-05-25 MED ORDER — LIDOCAINE 1 % OPTIME INJ - NO CHARGE
5.0000 mL | Freq: Once | INTRAMUSCULAR | Status: AC
  Administered 2024-05-25: 5 mL via INTRADERMAL
  Filled 2024-05-25: qty 6

## 2024-06-02 ENCOUNTER — Encounter: Payer: Self-pay | Admitting: Neurosurgery

## 2024-06-08 DIAGNOSIS — I4819 Other persistent atrial fibrillation: Secondary | ICD-10-CM | POA: Diagnosis not present

## 2024-06-08 DIAGNOSIS — Z7901 Long term (current) use of anticoagulants: Secondary | ICD-10-CM | POA: Diagnosis not present

## 2024-06-08 NOTE — Progress Notes (Signed)
 Documentation for warfarin management Last 3 INR results:  Lab Results  Component Value Date   INR 3.4 (!) 06/08/2024   INR 3.0 (!) 05/09/2024   INR 2.6 (!) 03/29/2024    Dosage adjustment or any change in the treatment regimen today: Yes INR above goal range. Patient reports increased tylenol  use recently. Patient advised to hold today's warfarin dose then resume regular warfarin dosing regimen of 15mg  total weekly dose, 3mg  (3mg  x 1) Mon,Wed,Fri and 1.5mg  (3mg  x 0.5).   Management of result: nurse driven protocol implemented  Patient/caregiver education provided during today's visit: limit tylenol  use to 2000mg  per day with warfarin therapy.  Patient/caregiver did verbalize understanding of the change in dose and any education provided.   The patient/caregiver is instructed to return in three weeks for recheck.  Future Appointments     Date/Time Provider Department Center Visit Type   06/26/2024 2:30 PM [facility] WEST CARDIO NURSE POD B [facility] C NURSE VISIT   06/26/2024 2:30 PM [facility]-ANTICOAG [facility] C ANTI-COAG   08/09/2024 2:00 PM Tobie Lady Plumb, MD [facility] C RHEUM RETURN VISIT   09/07/2024 10:00 AM [facility] WEST LAB [facility] C LAB   09/14/2024 1:15 PM Whitaker, Selinda Moose, PA [facility] MARYL BROCKS [facility] OFFICE VISIT   09/20/2024 12:30 PM [facility] WEST PULM PFT [facility] C PFT   09/20/2024 1:15 PM Parris Manna, MD [facility] C RETURN VISIT   09/21/2024 2:00 PM Alluri, Keller Grist, MD [facility] C FOLLOW UP

## 2024-06-13 ENCOUNTER — Encounter: Payer: Self-pay | Admitting: Pain Medicine

## 2024-06-13 ENCOUNTER — Ambulatory Visit: Admitting: Pain Medicine

## 2024-06-13 ENCOUNTER — Ambulatory Visit: Attending: Pain Medicine | Admitting: Pain Medicine

## 2024-06-13 ENCOUNTER — Telehealth: Payer: Self-pay

## 2024-06-13 VITALS — BP 95/57 | HR 81 | Temp 97.3°F | Ht 67.0 in | Wt 201.0 lb

## 2024-06-13 DIAGNOSIS — M51362 Other intervertebral disc degeneration, lumbar region with discogenic back pain and lower extremity pain: Secondary | ICD-10-CM | POA: Diagnosis not present

## 2024-06-13 DIAGNOSIS — I482 Chronic atrial fibrillation, unspecified: Secondary | ICD-10-CM | POA: Diagnosis not present

## 2024-06-13 DIAGNOSIS — M899 Disorder of bone, unspecified: Secondary | ICD-10-CM | POA: Insufficient documentation

## 2024-06-13 DIAGNOSIS — M79604 Pain in right leg: Secondary | ICD-10-CM | POA: Diagnosis present

## 2024-06-13 DIAGNOSIS — G8929 Other chronic pain: Secondary | ICD-10-CM | POA: Diagnosis present

## 2024-06-13 DIAGNOSIS — G894 Chronic pain syndrome: Secondary | ICD-10-CM | POA: Insufficient documentation

## 2024-06-13 DIAGNOSIS — Z79899 Other long term (current) drug therapy: Secondary | ICD-10-CM | POA: Insufficient documentation

## 2024-06-13 DIAGNOSIS — M4310 Spondylolisthesis, site unspecified: Secondary | ICD-10-CM | POA: Diagnosis not present

## 2024-06-13 DIAGNOSIS — R937 Abnormal findings on diagnostic imaging of other parts of musculoskeletal system: Secondary | ICD-10-CM | POA: Insufficient documentation

## 2024-06-13 DIAGNOSIS — M549 Dorsalgia, unspecified: Secondary | ICD-10-CM | POA: Diagnosis present

## 2024-06-13 DIAGNOSIS — M4804 Spinal stenosis, thoracic region: Secondary | ICD-10-CM | POA: Insufficient documentation

## 2024-06-13 DIAGNOSIS — M545 Low back pain, unspecified: Secondary | ICD-10-CM | POA: Diagnosis present

## 2024-06-13 DIAGNOSIS — M51369 Other intervertebral disc degeneration, lumbar region without mention of lumbar back pain or lower extremity pain: Secondary | ICD-10-CM | POA: Diagnosis present

## 2024-06-13 DIAGNOSIS — I4819 Other persistent atrial fibrillation: Secondary | ICD-10-CM | POA: Insufficient documentation

## 2024-06-13 DIAGNOSIS — M5134 Other intervertebral disc degeneration, thoracic region: Secondary | ICD-10-CM | POA: Diagnosis not present

## 2024-06-13 DIAGNOSIS — Z7901 Long term (current) use of anticoagulants: Secondary | ICD-10-CM | POA: Insufficient documentation

## 2024-06-13 DIAGNOSIS — Z789 Other specified health status: Secondary | ICD-10-CM | POA: Diagnosis not present

## 2024-06-13 DIAGNOSIS — Z950 Presence of cardiac pacemaker: Secondary | ICD-10-CM | POA: Diagnosis not present

## 2024-06-13 DIAGNOSIS — I071 Rheumatic tricuspid insufficiency: Secondary | ICD-10-CM | POA: Insufficient documentation

## 2024-06-13 NOTE — Patient Instructions (Signed)

## 2024-06-13 NOTE — Telephone Encounter (Signed)
 The patient was seen in our clinic today top evaluate for a Spinal Cord Stimulator.  Dr Naveira would like to get clearance for the patient to stop Coumadin  for 5 days prior to having any procedure.  Thank you for your time.

## 2024-06-13 NOTE — Progress Notes (Signed)
 Safety precautions to be maintained throughout the outpatient stay will include: orient to surroundings, keep bed in low position, maintain call bell within reach at all times, provide assistance with transfer out of bed and ambulation.

## 2024-06-13 NOTE — Progress Notes (Signed)
 PROVIDER NOTE: Interpretation of information contained herein should be left to medically-trained personnel. Specific patient instructions are provided elsewhere under Patient Instructions section of medical record. This document was created in part using AI and STT-dictation technology, any transcriptional errors that may result from this process are unintentional.  Patient: Kelly Olsen  Service: E/M Encounter  Provider: Eric DELENA Como, MD  DOB: 1941/06/28  Delivery: Face-to-face  Specialty: Interventional Pain Management  MRN: [id number]  Setting: Ambulatory outpatient facility  Specialty designation: 09  Type: New Patient  Location: Outpatient office facility  PCP: Cyrus Selinda Moose, PA-C  DOS: 06/13/2024    Referring Prov.: Clois Fret, MD   Primary Reason(s) for Visit: Encounter for initial evaluation of one or more chronic problems (new to examiner) potentially causing chronic pain, and posing a threat to normal musculoskeletal function. (Level of risk: High) CC: Back Pain (lower)  HPI  Kelly Olsen is a 83 y.o. year old, female patient, who comes for the first time to our practice referred by Clois Fret, MD for our initial evaluation of her chronic pain. She has Vertigo, intractable; Chronic obstructive pulmonary disease (HCC); Chronic atrial fibrillation (HCC); SA node dysfunction s/p pacemaker (HCC); Long term current use of anticoagulant; Presence of permanent cardiac pacemaker; H/O tricuspid valve repair; Polymyalgia rheumatica syndrome; Age-related osteoporosis without current pathological fracture; Asthma; Eosinophilic asthma; Atopic IgE mediated allergic disorder; Carpal tunnel syndrome, left; Carpal tunnel syndrome, right; Congestive heart failure (HCC); Edema; Essential hypertension; Gastro-esophageal reflux disease with esophagitis; Long term current use of systemic steroids; Medicare annual wellness visit, initial; Osteoarthritis of left knee; Primary  osteoarthritis involving multiple joints; Severe tricuspid valve insufficiency; Venous insufficiency of both lower extremities; Persistent atrial fibrillation (HCC); Chronic pain syndrome; Pharmacologic therapy; Disorder of skeletal system; Problems influencing health status; Abnormal CT scan, lumbar spine (05/30/2024); Abnormal CT of thoracic spine (05/30/2024); Abnormal x-ray of lumbar spine (05/11/2024); Chronic anticoagulation (Coumadin ); Cardiac pacemaker in situ; Chronic low back pain (1ry area of Pain) (Bilateral) (R>L) w/o sciatica (Intermittent); Chronic upper back pain (2ry area of Pain) (Bilateral); Chronic lower extremity pain (3ry area of Pain) (Right) (intermittent); 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 Today she comes in for evaluation of her Back Pain (lower)  Pain Assessment: Location: Left, Right Back Radiating: pain travel across her lower back Onset: More than a month ago Duration: Chronic pain Quality: Discomfort, Throbbing, Aching Severity: 9 /10 (subjective, self-reported pain score)  Effect on ADL: limits my daily activities Timing: Intermittent Modifying factors: meds and laying down BP: (!) 95/57  HR: 81  Onset and Duration: Gradual and Present longer than 3 months Cause of pain: Arthritis Severity: Getting worse, NAS-11 at its worse: 10/10, NAS-11 at its best: 6/10, NAS-11 now: 8/10, and NAS-11 on the average: 8/10 Timing: Not influenced by the time of the day Aggravating Factors: Motion, Prolonged standing, Twisting, Walking, Walking uphill, and Walking downhill Alleviating Factors: Lying down, Medications, and Resting Associated Problems: Dizziness, Fatigue, Inability to control bladder (urine), Inability to control bowel, Sweating, and Temperature changes Quality of Pain: Aching, Agonizing, and  Annoying Previous Examinations or Tests: X-rays and Neurosurgical evaluation Previous Treatments: Epidural steroid injections  Kelly Olsen is being evaluated for possible interventional pain management therapies for the treatment of her chronic pain.  Discussed the use of AI scribe software for clinical note transcription with the patient, who gave verbal consent to proceed.  History  of Present Illness   Kelly Olsen is an 83 year old female with chronic lower back pain who presents for evaluation of a possible spinal cord stimulator trial. She was referred by Dr. Katrina for evaluation of a possible spinal cord stimulator trial.  She experiences intermittent lower back pain, primarily across the back with occasional right-sided predominance, extending to the thoracic region below the shoulder blades. No abdominal pain or prior back surgeries. A spinal injection at [facility] did not relieve her pain, and physical therapy worsened her condition.  She is on prednisone for polymyalgia rheumatica, which helps control muscle pain in her head, arms, and shoulders. 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 No diabetes, chemotherapy, or vitamin B12 deficiencies.  She is on Coumadin  for atrial fibrillation, with a history of open heart surgery for tricuspid valve repair and a pacemaker. She has stopped Coumadin  for procedures in the past without issues.       Kelly Olsen has been informed that this initial visit was an evaluation only.  On the follow up appointment I will go over the results, including ordered tests and available interventional therapies. At that time she will have the opportunity to decide whether to proceed with offered therapies or not. In the event that Kelly Olsen prefers avoiding interventional options, this will conclude our involvement in the case.  Medication management recommendations may be provided upon  request.  Patient informed that diagnostic tests may be ordered to assist in identifying underlying causes, narrow the list of differential diagnoses and aid in determining candidacy for (or contraindications to) planned therapeutic interventions.  Historic Controlled Substance Pharmacotherapy Review PMP and historical list of controlled substances: Gabapentin  600 mg, 1 tab p.o. daily (30/month) (# 90) (last filled on 03/11/2024); tramadol 50 mg tablet, 1 tab p.o. twice daily (# 30) (last filled on 01/31/2024) Most recently prescribed controlled substance(s): Opioid Analgesic: None MME/day: 0 mg/day  Historical Monitoring: The patient  reports no history of drug use. List of prior UDS Testing: No results found for: MDMA, COCAINSCRNUR, PCPSCRNUR, PCPQUANT, CANNABQUANT, THCU, ETH, CBDTHCR, D8THCCBX, D9THCCBX Historical Background Evaluation: [place] PMP: PDMP reviewed during this encounter. Review of the past 63-months conducted.             PMP NARX Score Report:  Narcotic: 150 Sedative: 060 Stimulant: 000 [place] Department of public safety, offender search: Engineer, [employer]) Non-contributory Risk Assessment Profile: Aberrant behavior: None observed or detected today Risk factors for fatal opioid overdose: None identified today PMP NARX Overdose Risk Score: 020 Fatal overdose hazard ratio (HR): Calculation deferred Non-fatal overdose hazard ratio (HR): Calculation deferred Risk of opioid abuse or dependence: 0.7-3.0% with doses <= 36 MME/day and 6.1-26% with doses >= 120 MME/day. Substance use disorder (SUD) risk level: See below Personal History of Substance Abuse (SUD-Substance use disorder):  Alcohol:    Illegal Drugs:    Rx Drugs:    ORT Risk Level calculation:    ORT Scoring interpretation table:  Score <3 = Low Risk for SUD  Score between 4-7 = Moderate Risk for SUD  Score >8 = High Risk for Opioid Abuse   PHQ-2 Depression Scale:  Total score: 0  PHQ-2  Scoring interpretation table: (Score and probability of major depressive disorder)  Score 0 = No depression  Score 1 = 15.4% Probability  Score 2 = 21.1% Probability  Score 3 = 38.4% Probability  Score 4 = 45.5% Probability  Score 5 = 56.4%  Probability  Score 6 = 78.6% Probability   PHQ-9 Depression Scale:  Total score: 0  PHQ-9 Scoring interpretation table:  Score 0-4 = No depression  Score 5-9 = Mild depression  Score 10-14 = Moderate depression  Score 15-19 = Moderately severe depression  Score 20-27 = Severe depression (2.4 times higher risk of SUD and 2.89 times higher risk of overuse)   Pharmacologic Plan: As per protocol, I have not taken over any controlled substance management, pending the results of ordered tests and/or consults.            Initial impression: Pending review of available data and ordered tests.  Meds   Current Outpatient Medications:    acetaminophen  (TYLENOL ) 650 MG CR tablet, Take 650 mg by mouth every 8 (eight) hours as needed for pain., Disp: , Rfl:    albuterol  (PROVENTIL ) (2.5 MG/3ML) 0.083% nebulizer solution, Take 2.5 mg by nebulization every evening., Disp: , Rfl:    budesonide  (PULMICORT ) 0.5 MG/2ML nebulizer solution, Take 0.5 mg by nebulization 2 (two) times daily., Disp: , Rfl:    Calcium Carb-Cholecalciferol 1000-800 MG-UNIT TABS, Take 2 tablets by mouth daily., Disp: , Rfl:    carvedilol (COREG) 25 MG tablet, Take 25 mg by mouth 2 (two) times daily with a meal., Disp: , Rfl:    DULoxetine  (CYMBALTA ) 60 MG capsule, Take 60 mg by mouth daily., Disp: , Rfl:    Dupilumab (DUPIXENT) 300 MG/2ML SOPN, Inject 300 mg into the skin every 14 (fourteen) days., Disp: , Rfl:    EPINEPHrine 0.3 mg/0.3 mL IJ SOAJ injection, Inject 0.3 mg into the muscle as needed for anaphylaxis., Disp: , Rfl:    gabapentin  (NEURONTIN ) 600 MG tablet, Take 600 mg by mouth at bedtime., Disp: , Rfl:    Ipratropium-Albuterol  (COMBIVENT) 20-100 MCG/ACT AERS respimat, Inhale 2  puffs into the lungs every 6 (six) hours as needed., Disp: , Rfl:    losartan (COZAAR) 50 MG tablet, Take 50 mg by mouth daily., Disp: , Rfl:    meclizine  (ANTIVERT ) 25 MG tablet, Take 1 tablet (25 mg total) by mouth 2 (two) times daily as needed for dizziness., Disp: 20 tablet, Rfl: 0   montelukast (SINGULAIR) 10 MG tablet, Take 10 mg by mouth at bedtime., Disp: , Rfl:    Multiple Vitamin (MULTIVITAMIN) capsule, Take 1 capsule by mouth daily., Disp: , Rfl:    nortriptyline  (PAMELOR ) 10 MG capsule, Take 10 mg by mouth at bedtime., Disp: , Rfl:    omeprazole (PRILOSEC) 40 MG capsule, Take 40 mg by mouth daily., Disp: , Rfl:    Potassium 99 MG TABS, Take 1 tablet by mouth 2 (two) times daily., Disp: , Rfl:    predniSONE (DELTASONE) 5 MG tablet, Take 5 mg by mouth daily., Disp: , Rfl:    torsemide (DEMADEX) 20 MG tablet, Take 20 mg by mouth 2 (two) times daily., Disp: , Rfl:    warfarin (COUMADIN ) 3 MG tablet, Take 1 tablet by mouth daily. Take as directed (3 mg  on Wed and Fri then 1.5 mg every other day), Disp: , Rfl: 2  Imaging Review  Shoulder Imaging: Shoulder-L DG: Results for orders placed during the hospital encounter of 04/07/24 DG Shoulder Left  Narrative CLINICAL DATA:  Two falls yesterday.  EXAM: LEFT SHOULDER - 2+ VIEW  COMPARISON:  None Available.  FINDINGS: No acute fracture or dislocation of the left shoulder. Mild glenohumeral and acromioclavicular joint osteoarthritis. Partially visualized left-sided pacemaker. Redemonstrated chronic discontinuity of superior most median  sternotomy wire.  IMPRESSION: 1. No acute fracture or dislocation of the left shoulder. 2.  Mild glenohumeral and acromioclavicular joint osteoarthritis.   Electronically Signed By: Harrietta Sherry M.D. On: 04/07/2024 11:23  Thoracic Imaging: Thoracic CT w contrast: Results for orders placed during the hospital encounter of 05/25/24 CT THORACIC SPINE W CONTRAST  Narrative EXAM: CT  THORACIC SPINE WITH CONTRAST 05/25/2024 11:12:16 AM  TECHNIQUE: CT of the thoracic spine was performed with the administration of 10 mL of Omnipaque  300 intravenous contrast. Multiplanar reformatted images are provided for review. Automated exposure control, iterative reconstruction, and/or weight based adjustment of the mA/kV was utilized to reduce the radiation dose to as low as reasonably achievable.  COMPARISON: None available.  CLINICAL HISTORY: Chronic pain, post myelogram.  FINDINGS:  BONES AND ALIGNMENT: Normal vertebral body heights. No acute fracture or suspicious bone lesion. Mild levoscoliosis of the lower thoracic spine. Slight degenerative anterolisthesis at C7-T1.  DEGENERATIVE CHANGES: Mild degenerative disc disease present throughout the thoracic spine. At T10-T11, there is disc space narrowing, posterior disc bulging, and endplate ridging causing mild-to-moderate central spinal canal stenosis, more pronounced on the right. There is no apparent spinal cord or nerve root impingement. At T11-T12, there is minimal disc bulging without significant spinal canal or neural foraminal stenosis. Moderate chronic degenerative disc disease at C5-C6 with posterior endplate ridging causing mild-to-moderate central spinal canal stenosis. There are a couple of bubbles of air present within the thecal sac at T8 and T10, which appear to be iatrogenic.  SOFT TISSUES: A cardiac pacemaker is incidentally noted.  LUNGS: Mild atelectasis present independently within the lungs.  IMPRESSION: 1. Mild-to-moderate central spinal canal stenosis at T10-11, greater on the right, without apparent spinal cord or nerve root impingement. 2. Minimal disc bulging at T11-12 without significant spinal canal or neural foraminal stenosis. 3. Mild levoscoliosis of the lower thoracic spine. 4. Moderate chronic degenerative disc disease at C5-6 with posterior endplate ridging causing  mild-to-moderate central spinal canal stenosis. 5. Slight degenerative anterolisthesis at C7-T1.  Electronically signed by: Evalene Coho MD 05/30/2024 07:15 PM EST RP Workstation: [id number]  Lumbosacral Imaging: Lumbar CT wo contrast: Results for orders placed during the hospital encounter of 12/08/23 CT LUMBAR SPINE WO CONTRAST  Narrative CLINICAL DATA:  Lumbar radiculitis, acute on chronic lower back pain with radiation of pain into the right groin and anterior right thigh and knee.  EXAM: CT LUMBAR SPINE WITHOUT CONTRAST  TECHNIQUE: Multidetector CT imaging of the lumbar spine was performed without intravenous contrast administration. Multiplanar CT image reconstructions were also generated.  RADIATION DOSE REDUCTION: This exam was performed according to the departmental dose-optimization program which includes automated exposure control, adjustment of the mA and/or kV according to patient size and/or use of iterative reconstruction technique.  COMPARISON:  CT renal stone protocol 04/10/2022.  FINDINGS: Segmentation: There are 6 non-rib-bearing lumbar type vertebral bodies labeled L1 through L6. The lowest well-formed disc space is labeled as L6-S1. Review of CT chest from 07/03/2021 confirms presence of 12 rib-bearing thoracic type vertebra.  Alignment: Lumbar lordosis is maintained. There is 3 mm retrolisthesis of L3 on L4. Subtle levocurvature in the lower lumbar spine.  Vertebrae: No compression fracture or displaced fracture in the lumbar spine. Degenerative endplate changes at multiple levels. There is partial fusion of the L2 and L3 vertebral along the left aspect. No suspicious lytic or blastic osseous lesion. Sclerotic focus in the right sacral ala is unchanged since 2023.  Paraspinal and other soft tissues: The paraspinal  soft tissues are unremarkable. Nonobstructing renal calculi on the left measuring up to 0.4 cm. Additional calcified focus within  the right renal parenchyma measuring up to 0.6 cm. 2.6 cm right renal cyst, no follow-up required. 0.5 slightly hyperattenuating focus within the anterior aspect of the left kidney.  Disc levels:  T12-L1: Visualized on sagittal images without significant spinal canal stenosis. Foramina are partially visualized without evidence of high-grade stenosis.  L1-2: Mild disc space narrowing and vacuum disc phenomenon. Disc bulge visualized on sagittal images without high-grade spinal canal stenosis. No high-grade foraminal stenosis.  L2-3: Severe disc height loss with partial fusion of the vertebral bodies on the left. Disc bulge and posterior osteophytes without significant spinal canal stenosis. Mild facet arthrosis. Mild foraminal stenosis on the left.  L3-4: Severe disc space narrowing and vacuum disc phenomenon. Disc bulge and posterior osteophytes resulting in lateral recess narrowing. Bilateral facet arthrosis. No significant central spinal canal stenosis. There is severe right foraminal stenosis.  L4-5: Diffuse disc bulge and posterior osteophytes resulting in lateral recess narrowing. There is additional prominence of the dorsal epidural fat indenting the dorsal thecal sac likely resulting in severe spinal canal stenosis. Moderate facet arthrosis slightly greater on the right. There is moderate bilateral foraminal stenosis.  L5-L6: Diffuse disc bulge and posterior osteophytes resulting in lateral recess narrowing. Bilateral facet arthrosis and thickening of the ligamentum flavum. There is at least moderate spinal canal stenosis. Mild right and moderate left foraminal stenosis.  L6-S1: Mild disc height loss. Small disc bulge eccentric to the left with posterior osteophytes possibly resulting in lateral recess narrowing. Bilateral facet arthrosis. No significant central spinal canal stenosis. Mild-to-moderate right foraminal stenosis.  IMPRESSION: Degenerative changes of the  lumbar spine as above. 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  Additional moderate spinal canal stenosis at L5-6.  Multilevel foraminal stenosis, greatest and severe on the right at L3-4. Additional moderate foraminal stenosis bilaterally at L4-5 and on the left at L5-6.  Transitional anatomy as noted above with 6 non-rib-bearing lumbar type vertebra. The lowest well-formed disc space is labeled L6-S1. Recommend radiographic correlation prior to any potential surgical intervention.  Nonobstructing nephrolithiasis in the left kidney measuring up to 0.4 cm. Parenchymal calcification in the posterior right kidney. Additional slightly hyperattenuating focus within the anterior aspect of the left kidney, recommend renal ultrasound for further evaluation.   Electronically Signed By: Donnice Mania M.D. On: 12/24/2023 21:48  Lumbar CT w contrast: Results for orders placed during the hospital encounter of 05/25/24 CT LUMBAR SPINE W CONTRAST  Narrative EXAM: CT OF THE LUMBAR SPINE WITH CONTRAST 05/25/2024 11:12:16 AM  TECHNIQUE: CT of the lumbar spine was performed with the administration of intravenous contrast. Multiplanar reformatted images are provided for review. Automated exposure control, iterative reconstruction, and/or weight based adjustment of the mA/kV was utilized to reduce the radiation dose to as low as reasonably achievable.  COMPARISON: CT Lumbar Spine without contrast 12/08/2023.  CLINICAL HISTORY: Low back pain, post myelogram.  FINDINGS:  BONES AND ALIGNMENT: There are 6 non-rib-bearing lumbar type vertebrae again demonstrated. The last well-formed disc space will again be considered L6-S1. There is mild sigmoid scoliosis of the thoracolumbar spine, with the lower lumbar curvature convex to the left and the upper curvature convex to the right. Normal vertebral  body heights. No acute fracture or suspicious bone lesion.  DEGENERATIVE CHANGES: T12-L1: Mild right posterolateral disc bulging causing mild central spinal canal stenosis  and right lateral recess stenosis. L1-L2: There is broad-based disc bulging with mild-to-moderate central spinal canal stenosis and mild bilateral lateral recess stenosis. L2-L3: There is moderate disc space loss and minimal disc bulging. No significant spinal canal or neural foraminal stenosis. L3-L4: There is slight degenerative retrolisthesis. There is broad-based disc bulging and endplate ridging with mild central spinal canal stenosis and mild-to-moderate bilateral lateral recess stenosis. No definite nerve root impingement. L4-L5: There is broad-based disc bulging and bilateral facet hypertrophy, with moderate central spinal canal stenosis and moderate bilateral lateral recess stenosis. There appears to be impingement of the L5 nerves in the lateral recesses bilaterally. L5-L6: There is broad-based disc bulge and bilateral facet arthrosis, with mild-to-moderate central spinal canal stenosis and bilateral lateral recess stenosis. No apparent nerve root impingement. L6-S1: The spinal canal and neural foramina are widely patent.  SOFT TISSUES: No acute abnormality.  IMPRESSION: 1. Mild sigmoid scoliosis of the thoracolumbar spine, as described. 2. 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 3. Multilevel degenerative changes, most pronounced at L4-5 with moderate central canal stenosis, moderate bilateral lateral recess stenosis, and impingement of the L5 nerve roots in the lateral recesses bilaterally.  Electronically signed by: Evalene Coho MD 05/30/2024 07:09 PM EST RP Workstation: [id number]  Lumbar DG (Complete) 4+V: Results for orders placed in visit on 05/11/24 DG Lumbar Spine Complete  Narrative EXAM: 4  VIEW(S) XRAY OF THE LUMBAR SPINE 05/11/2024 10:46:25 AM  COMPARISON: None available.  CLINICAL HISTORY: back and leg pain. Chronic bilateral low back pain without sciatica. Pt having trouble walking and standing for more than 5 minutes at a time  FINDINGS:  LUMBAR SPINE:  BONES: Demineralization and degenerative change limits assessment for subtle fractures. 4 mm of retrolisthesis of L2 on neutral view, maintained with flexion, increasing to 7 mm with extension. Additional 4 mm of retrolisthesis of L3 with extension. Additional 2 mm of retrolisthesis of L4 with extension. No aggressive appearing osseous lesion.  DISCS AND DEGENERATIVE CHANGES: Multilevel spondylosis. Disc space height loss greatest at L1-L2 and L2-L3 where it is advanced.  SOFT TISSUES: No acute abnormality.  IMPRESSION: 1. Multilevel spondylosis with advanced disc space height loss at L1-L2 and L2-L3. 2. Dynamic retrolisthesis with instability: L2 retrolisthesis 4 mm on neutral/flexion increasing to 7 mm on extension; L3 retrolisthesis 4 mm on extension; L4 retrolisthesis 2 mm on extension.  Electronically signed by: Norman Gatlin MD 05/15/2024 11:51 PM EDT RP Workstation: [id number]  Hip Imaging: Hip-L DG 2-3 views: Results for orders placed during the hospital encounter of 04/07/24 DG Hip Unilat W or Wo Pelvis 2-3 Views Left  Narrative CLINICAL DATA:  Two falls yesterday.  EXAM: DG HIP (WITH OR WITHOUT PELVIS) 2-3V LEFT  COMPARISON:  CT abdomen/pelvis dated 04/10/2022.  FINDINGS: No acute fracture or dislocation. Bilateral hip arthroplasties with normal alignment. Sacroiliac joints and pubic symphysis are anatomically aligned.  IMPRESSION: No acute osseous abnormality.   Electronically Signed By: Harrietta Sherry M.D. On: 04/07/2024 11:27  Knee Imaging: Knee-L CT wo contrast: Results for orders placed during the hospital encounter of 04/07/24 CT Knee Left Wo  Contrast  Narrative CLINICAL DATA:  Fall, knee trauma  EXAM: CT OF THE LEFT KNEE WITHOUT CONTRAST  TECHNIQUE: Multidetector CT imaging of the left knee was performed according to the standard protocol. Multiplanar CT image reconstructions were also generated.  RADIATION DOSE REDUCTION: This exam was performed according to the departmental dose-optimization program which includes automated exposure control, adjustment  of the mA and/or kV according to patient size and/or use of iterative reconstruction technique.  COMPARISON:  Radiographs 04/07/2024  FINDINGS: Bones/Joint/Cartilage  Prominent osteoarthritis with tricompartmental spurring and articular space narrowing most severely in the lateral compartment.  No fracture is identified.  Moderate knee joint effusion.  Ligaments  Suboptimally assessed by CT.  Muscles and Tendons  Mild regional muscular atrophy.  Soft tissues  Prepatellar subcutaneous edema.  IMPRESSION: 1. No fracture is identified. 2. Prominent osteoarthritis with tricompartmental spurring and articular space narrowing most severely in the lateral compartment. 3. Moderate knee joint effusion. 4. Prepatellar subcutaneous edema.   Electronically Signed By: Ryan Salvage M.D. On: 04/07/2024 13:33  Knee-L DG 4 views: Results for orders placed during the hospital encounter of 04/07/24 DG Knee Complete 4 Views Left  Narrative CLINICAL DATA:  Two falls yesterday.  EXAM: LEFT KNEE - COMPLETE 4+ VIEW  COMPARISON:  None Available.  FINDINGS: No acute fracture or dislocation. Moderate tricompartmental osteoarthritis with joint space narrowing and marginal osteophytosis. Small knee joint effusion. Soft tissues are unremarkable.  IMPRESSION: 1. No acute osseous abnormality. 2. 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   Electronically Signed By: Harrietta Sherry M.D. On: 04/07/2024  11:25  Complexity Note: Imaging results reviewed.                         ROS  Cardiovascular: Abnormal heart rhythm, Heart surgery, Pacemaker or defibrillator, Blood thinners:  Anticoagulant, and Irregular heart rate Pulmonary or Respiratory: Wheezing and difficulty taking a deep full breath (Asthma) Neurological: No reported neurological signs or symptoms such as seizures, abnormal skin sensations, urinary and/or fecal incontinence, being born with an abnormal open spine and/or a tethered spinal cord Psychological-Psychiatric: No reported psychological or psychiatric signs or symptoms such as difficulty sleeping, anxiety, depression, delusions or hallucinations (schizophrenial), mood swings (bipolar disorders) or suicidal ideations or attempts Gastrointestinal: No reported gastrointestinal signs or symptoms such as vomiting or evacuating blood, reflux, heartburn, alternating episodes of diarrhea and constipation, inflamed or scarred liver, or pancreas or irrregular and/or infrequent bowel movements Genitourinary: Passing kidney stones Hematological: No reported hematological signs or symptoms such as prolonged bleeding, low or poor functioning platelets, bruising or bleeding easily, hereditary bleeding problems, low energy levels due to low hemoglobin or being anemic Endocrine: No reported endocrine signs or symptoms such as high or low blood sugar, rapid heart rate due to high thyroid levels, obesity or weight gain due to slow thyroid or thyroid disease Rheumatologic: Polymyalgia rhematica Musculoskeletal: Negative for myasthenia gravis, muscular dystrophy, multiple sclerosis or malignant hyperthermia Work History: Retired  Allergies  Ms. Besse is allergic to tramadol, butalbital-apap-caff-cod, codeine, amlodipine, and butalbital-apap-caffeine.  Laboratory Chemistry Profile   Renal Lab Results  Component Value Date   BUN 13 04/20/2024   CREATININE 0.84 04/20/2024   GFRAA >60 02/07/2015    GFRNONAA >60 04/20/2024   SPECGRAV 1.010 05/03/2024   PHUR 6.0 05/03/2024   PROTEINUR Negative 05/03/2024     Electrolytes Lab Results  Component Value Date   NA 142 04/20/2024   K 4.2 04/20/2024   CL 100 04/20/2024   CALCIUM 9.6 04/20/2024     Hepatic Lab Results  Component Value Date   AST 38 04/20/2024   ALT 21 04/20/2024   ALBUMIN 3.4 (L) 04/20/2024   ALKPHOS 60 04/20/2024     ID No results found for: LYMEIGGIGMAB, HIV, SARSCOV2NAA, STAPHAUREUS, MRSAPCR, HCVAB, PREGTESTUR, RMSFIGG, QFVRPH1IGG, QFVRPH2IGG  Bone No results found for: VD25OH, [id number], [id number], CI7874NY7, 25OHVITD1, 25OHVITD2, 25OHVITD3, TESTOFREE, TESTOSTERONE   Endocrine Lab Results  Component Value Date   GLUCOSE 109 (H) 04/20/2024   GLUCOSEU Negative 05/03/2024     Neuropathy No results found for: VITAMINB12, FOLATE, HGBA1C, HIV   CNS No results found for: COLORCSF, APPEARCSF, RBCCOUNTCSF, WBCCSF, POLYSCSF, LYMPHSCSF, EOSCSF, PROTEINCSF, GLUCCSF, JCVIRUS, CSFOLI, IGGCSF, LABACHR, ACETBL   Inflammation (CRP: Acute  ESR: Chronic) No results found for: CRP, ESRSEDRATE, LATICACIDVEN   Rheumatology No results found for: RF, ANA, LABURIC, URICUR, LYMEIGGIGMAB, LYMEABIGMQN, HLAB27   Coagulation Lab Results  Component Value Date   INR 2.2 (H) 04/22/2024   LABPROT 25.5 (H) 04/22/2024   PLT 380 04/20/2024   DDIMER 0.37 01/22/2021     Cardiovascular Lab Results  Component Value Date   TROPONINI <0.03 02/07/2015   HGB 12.9 04/20/2024   HCT 39.4 04/20/2024     Screening No results found for: SARSCOV2NAA, COVIDSOURCE, STAPHAUREUS, MRSAPCR, HCVAB, HIV, PREGTESTUR   Cancer No results found for: CEA, CA125, LABCA2   Allergens No results found for: ALMOND, APPLE, ASPARAGUS, AVOCADO, BANANA, BARLEY, BASIL, BAYLEAF, GREENBEAN, LIMABEAN, WHITEBEAN,  BEEFIGE, REDBEET, BLUEBERRY, BROCCOLI, CABBAGE, MELON, CARROT, CASEIN, CASHEWNUT, CAULIFLOWER, CELERY     Note: Lab results reviewed.  PFSH  Drug: Ms. Kehoe  reports no history of drug use. Alcohol:  reports no history of alcohol use. Tobacco:  reports that she has never smoked. She has never used smokeless tobacco. Medical:  has a past medical history of Aortic atherosclerosis, Arthritis, Atrial fibrillation (HCC), Bilateral carpal tunnel syndrome, COPD (chronic obstructive pulmonary disease) (HCC), Eosinophilic asthma, GERD (gastroesophageal reflux disease), History of 2019 novel coronavirus disease (COVID-19) (04/2022), History of bilateral cataract extraction, History of kidney stones, Hypertension, Long term current use of anticoagulant, Mild cardiomegaly, Neuropathy of both feet, Presence of permanent cardiac pacemaker (2009), SA node dysfunction (HCC), and Severe tricuspid valve insufficiency. Family: family history is not on file.  Past Surgical History:  Procedure Laterality Date   BILATERAL CARPAL TUNNEL RELEASE Bilateral    CARDIAC ELECTROPHYSIOLOGY STUDY AND ABLATION N/A 2004   CARDIAC ELECTROPHYSIOLOGY STUDY AND ABLATION N/A 2006   CARDIAC VALVE SURGERY N/A    Procedure: TRICUSPID VALVE REPAIR; Location: [place]    CARPAL TUNNEL RELEASE Right 02/10/2023   Procedure: REVISION OPEN CARPAL TUNNEL RELEASE;  Surgeon: Edie Norleen PARAS, MD;  Location: [facility];  Service: Orthopedics;  Laterality: Right;  2nd case   CARPAL TUNNEL RELEASE Left 03/31/2023   Procedure: OPEN CARPAL TUNNEL RELEASE;  Surgeon: Edie Norleen PARAS, MD;  Location: [facility];  Service: Orthopedics;  Laterality: Left;  2nd case   CATARACT EXTRACTION, BILATERAL     CHOLECYSTECTOMY     PACEMAKER BATTERY CHANGE N/A 2018   Procedure: PACEMAKER BATTERY CHANGE; Location: [place]    PACEMAKER INSERTION N/A 2009   Procedure: PACEMAKER INSERTION; Location: [place]    TOTAL HIP ARTHROPLASTY Bilateral    TOTAL  KNEE ARTHROPLASTY Right    Active Ambulatory Problems    Diagnosis Date Noted   Vertigo, intractable 04/21/2024   Chronic obstructive pulmonary disease (HCC) 09/05/2015   Chronic atrial fibrillation (HCC) 09/05/2015   SA node dysfunction s/p pacemaker [facility])    Long term current use of anticoagulant    Presence of permanent cardiac pacemaker 2009   H/O tricuspid valve repair 03/31/2019   Polymyalgia rheumatica syndrome 03/18/2023   Age-related osteoporosis without current pathological fracture 08/12/2023   Asthma 05/12/2021   Eosinophilic asthma 05/24/2024   Atopic IgE mediated allergic  disorder 05/24/2024   Carpal tunnel syndrome, left 09/07/2022   Carpal tunnel syndrome, right 01/20/2023   Congestive heart failure (HCC) 07/09/2016   Edema 07/09/2016   Essential hypertension 09/05/2015   Gastro-esophageal reflux disease with esophagitis 05/12/2021   Long term current use of systemic steroids 08/12/2023   Medicare annual wellness visit, initial 02/09/2019   Osteoarthritis of left knee 10/03/2021   Primary osteoarthritis involving multiple joints 08/12/2023   Severe tricuspid valve insufficiency 04/21/2019   Venous insufficiency of both lower extremities 04/11/2019   Persistent atrial fibrillation (HCC) 04/21/2019   Chronic pain syndrome 06/13/2024   Pharmacologic therapy 06/13/2024   Disorder of skeletal system 06/13/2024   Problems influencing health status 06/13/2024   Abnormal CT scan, lumbar spine (05/30/2024) 06/13/2024   Abnormal CT of thoracic spine (05/30/2024) 06/13/2024   Abnormal x-ray of lumbar spine (05/11/2024) 06/13/2024   Chronic anticoagulation (Coumadin ) 06/13/2024   Cardiac pacemaker in situ 06/13/2024   Chronic low back pain (1ry area of Pain) (Bilateral) (R>L) w/o sciatica (Intermittent) 06/13/2024   Chronic upper back pain (2ry area of Pain) (Bilateral) 06/13/2024   Chronic lower extremity pain (3ry area of Pain) (Right) (intermittent) 06/13/2024    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) 06/13/2024   DDD (degenerative disc disease), thoracic 06/13/2024   DDD (degenerative disc disease), lumbar 06/13/2024   Lumbar Dynamic Retrolisthesis w/ instability 06/13/2024   Resolved Ambulatory Problems    Diagnosis Date Noted   No Resolved Ambulatory Problems   Past Medical History:  Diagnosis Date   Aortic atherosclerosis    Arthritis    Atrial fibrillation (HCC)    Bilateral carpal tunnel syndrome    COPD (chronic obstructive pulmonary disease) (HCC)    GERD (gastroesophageal reflux disease)    History of 2019 novel coronavirus disease (COVID-19) 04/2022   History of bilateral cataract extraction    History of kidney stones    Hypertension    Mild cardiomegaly    Neuropathy of both feet    Constitutional Exam  General appearance: Well nourished, well developed, and well hydrated. In no apparent acute distress Vitals:   06/13/24 1409  BP: (!) 95/57  Pulse: 81  Temp: (!) 97.3 F (36.3 C)  SpO2: 96%  Weight: 201 lb (91.2 kg)  Height: 5' 7 (1.702 m)   BMI Assessment: Estimated body mass index is 31.48 kg/m as calculated from the following:   Height as of this encounter: 5' 7 (1.702 m).   Weight as of this encounter: 201 lb (91.2 kg).  BMI interpretation table: BMI level Category Range association with higher incidence of chronic pain  <18 kg/m2 Underweight   18.5-24.9 kg/m2 Ideal body weight   25-29.9 kg/m2 Overweight Increased incidence by 20%  30-34.9 kg/m2 Obese (Class I) Increased incidence by 68%  35-39.9 kg/m2 Severe obesity (Class II) Increased incidence by 136%  >40 kg/m2 Extreme obesity (Class III) Increased incidence by 254%   Patient's current BMI Ideal Body weight  Body mass index is 31.48 kg/m. Ideal body weight: 61.6 kg (135 lb 12.9 oz) Adjusted ideal body weight: 73.4 kg (161 lb 14.1 oz)   BMI Readings from Last 4 Encounters:  06/13/24 31.48 kg/m  05/25/24 31.48 kg/m  05/11/24 31.81 kg/m   05/03/24 32.28 kg/m   Wt Readings from Last 4 Encounters:  06/13/24 201 lb (91.2 kg)  05/25/24 201 lb (91.2 kg)  05/11/24 203 lb 2 oz (92.1 kg)  05/03/24 200 lb (90.7 kg)    Psych/Mental status: Alert, oriented x 3 (  person, place, & time)       Eyes: PERLA Respiratory: No evidence of acute respiratory distress  Assessment  Primary Diagnosis & Pertinent Problem List: The primary encounter diagnosis was Chronic low back pain (1ry area of Pain) (Bilateral) (R>L) w/o sciatica. Diagnoses of Chronic upper back pain (2ry area of Pain) (Bilateral), Chronic lower extremity pain (3ry area of Pain) (Right) (intermittent), Thoracic central spinal stenosis, DDD (degenerative disc disease), thoracic, Degeneration of intervertebral disc of lumbar region, unspecified whether pain present, Lumbar Dynamic Retrolisthesis w/ instability, Abnormal CT scan, lumbar spine (05/30/2024), Abnormal CT of thoracic spine (05/30/2024), Abnormal x-ray of lumbar spine, Chronic pain syndrome, Pharmacologic therapy, Disorder of skeletal system, Problems influencing health status, Chronic anticoagulation (Coumadin ), Long term current use of anticoagulant, Chronic atrial fibrillation (HCC), Persistent atrial fibrillation (HCC), Severe tricuspid valve insufficiency, and Cardiac pacemaker in situ were also pertinent to this visit.  Visit Diagnosis (New problems to examiner): 1. Chronic low back pain (1ry area of Pain) (Bilateral) (R>L) w/o sciatica   2. Chronic upper back pain (2ry area of Pain) (Bilateral)   3. Chronic lower extremity pain (3ry area of Pain) (Right) (intermittent)   4. Thoracic central spinal stenosis   5. DDD (degenerative disc disease), thoracic   6. Degeneration of intervertebral disc of lumbar region, unspecified whether pain present   7. Lumbar Dynamic Retrolisthesis w/ instability   8. Abnormal CT scan, lumbar spine (05/30/2024)   9. Abnormal CT of thoracic spine (05/30/2024)   10. Abnormal x-ray of  lumbar spine   11. Chronic pain syndrome   12. Pharmacologic therapy   13. Disorder of skeletal system   14. Problems influencing health status   15. Chronic anticoagulation (Coumadin )   16. Long term current use of anticoagulant   17. Chronic atrial fibrillation (HCC)   18. Persistent atrial fibrillation (HCC)   19. Severe tricuspid valve insufficiency   20. Cardiac pacemaker in situ    Plan of Care (Initial workup plan)  Note: Ms. Jons was reminded that as per protocol, today's visit has been an evaluation only. We have not taken over the patient's controlled substance management.  Problem-specific plan: Assessment and Plan    Chronic low back and thoracic pain   She experiences intermittent pain primarily in the lower back above the waistline, worse on the right side, extending to the thoracic region below the shoulder blades. A previous spine injection was ineffective, and physical therapy exacerbated symptoms. CT scans show some narrowing but not severe enough to preclude a spinal cord stimulator trial. A medical psychology evaluation cleared her for the trial. Lab work is ordered to ensure readiness. Cardiologist's clearance will be obtained to stop warfarin and confirm the compatibility of the spinal cord stimulator with her pacemaker. The spinal cord stimulator trial will be scheduled if all clearances are obtained.  Polymyalgia rheumatica   Managed with prednisone 5 mg daily, effectively controlling muscle pain in the head, arms, and shoulders. No other medications are used for PMR.  Peripheral neuropathy of both feet   Diagnosed 7-8 years ago and managed with gabapentin  600 mg daily. There is no history of diabetes, chemotherapy, or vitamin B12 deficiency.  Chronic atrial fibrillation on warfarin anticoagulation   Managed with warfarin 3 mg three times a week and half dose the rest of the week. There are no issues with stopping warfarin for procedures, though there is an  increased risk of blood clots during cessation. Cardiologist's clearance will be obtained to stop warfarin prior to  the spinal cord stimulator trial.  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 No recent heart problems are reported. Cardiologist's clearance will be obtained to confirm pacemaker compatibility with the spinal cord stimulator.       Lab Orders         Comp. Metabolic Panel (12)         Magnesium         Vitamin B12         Sedimentation rate         25-Hydroxy vitamin D Lcms D2+D3         C-reactive protein     Imaging Orders  No imaging studies ordered today   Referral Orders  No referral(s) requested today   Procedure Orders    No procedure(s) ordered today   Pharmacotherapy (current): Medications ordered:  No orders of the defined types were placed in this encounter.  Medications administered during this visit: Julita Ozbun had no medications administered during this visit.   Analgesic Pharmacotherapy:  Opioid Analgesics: For patients currently taking or requesting to take opioid analgesics, in accordance with [place]  Medical Board Guidelines, we will assess their risks and indications for the use of these substances. After completing our evaluation, we may offer recommendations, but we no longer take patients for medication management. The prescribing physician will ultimately decide, based on his/her training and level of comfort whether to adopt any of the recommendations, including whether or not to prescribe such medicines.  Membrane stabilizer: To be determined at a later time  Muscle relaxant: To be determined at a later time  NSAID: To be determined at a later time  Other analgesic(s): To be determined at a later time   Interventional management options: Ms. Zertuche was informed that there is no guarantee that she would be a  candidate for interventional therapies. The decision will be based on the results of diagnostic studies, as well as Ms. Mattioli's risk profile.  Procedure(s) under consideration:  Pending results of ordered studies     Interventional Therapies  Risk Factors  Considerations  Medical Comorbidities:  Coumadin  Anticoagulation: (Stop: 5 days  Restart: 2 hours)  A-Fib  CHF  Hx. Tricuspid Repair (2005)  Pacemaker in-situ     Planned  Pending:      Under consideration:   Diagnostic bilateral lumbar spinal cord stimulator trial    Completed: (Analgesic benefit)1  None at this time   Therapeutic  Palliative (PRN) options:   None established   Completed by other providers:   Medical psychology evaluation for trial completed.  No contraindications.  1(Analgesic benefit): Expressed in percentage (%). (Local anesthetic[LA] +/- sedation  L.A.Local Anesthetic  Steroid benefit  Ongoing benefit)   Provider-requested follow-up: Return in about 2 weeks (around 06/27/2024) for ( ), Eval-day (M,W), (Face2F), 2nd Visit, to review of ordered test(s).  Future Appointments  Date Time Provider Department Center  06/14/2024  1:15 PM [facility]  06/19/2024  2:00 PM Stoioff, Glendia BROCKS, MD [facility] None  06/29/2024  1:20 PM Tanya Glisson, MD [facility] None   I discussed the assessment and treatment plan with the patient. The patient was provided an opportunity to ask questions and all were answered. The patient agreed with the plan and demonstrated an understanding of the instructions.  Patient advised to call back or seek an in-person evaluation if the symptoms or condition worsens.  Duration  of encounter: 46 minutes.  Total time on encounter, as per AMA guidelines included both the face-to-face and non-face-to-face time personally spent by the physician and/or other qualified health care professional(s) on the day of the encounter (includes time in activities that require the  physician or other qualified health care professional and does not include time in activities normally performed by clinical staff). Physician's time may include the following activities when performed: Preparing to see the patient (e.g., pre-charting review of records, searching for previously ordered imaging, lab work, and nerve conduction tests) Review of prior analgesic pharmacotherapies. Reviewing PMP Interpreting ordered tests (e.g., lab work, imaging, nerve conduction tests) Performing post-procedure evaluations, including interpretation of diagnostic procedures Obtaining and/or reviewing separately obtained history Performing a medically appropriate examination and/or evaluation Counseling and educating the patient/family/caregiver Ordering medications, tests, or procedures Referring and communicating with other health care professionals (when not separately reported) Documenting clinical information in the electronic or other health record Independently interpreting results (not separately reported) and communicating results to the patient/ family/caregiver Care coordination (not separately reported)  Note by: Eric DELENA Como, MD (TTS and AI technology used. I apologize for any typographical errors that were not detected and corrected.) Date: 06/13/2024; Time: 3:51 PM

## 2024-06-14 ENCOUNTER — Ambulatory Visit
Admission: RE | Admit: 2024-06-14 | Discharge: 2024-06-14 | Disposition: A | Discharge status: Home / Self Care | Source: Ambulatory Visit | Attending: Urology | Admitting: Urology

## 2024-06-14 DIAGNOSIS — R3129 Other microscopic hematuria: Secondary | ICD-10-CM | POA: Diagnosis not present

## 2024-06-14 MED ORDER — IOHEXOL 300 MG/ML  SOLN
100.0000 mL | Freq: Once | INTRAMUSCULAR | Status: AC | PRN
  Administered 2024-06-14: 100 mL via INTRAVENOUS

## 2024-06-19 ENCOUNTER — Encounter: Payer: Self-pay | Admitting: Urology

## 2024-06-19 ENCOUNTER — Ambulatory Visit: Admitting: Urology

## 2024-06-19 VITALS — BP 133/84 | HR 81 | Ht 67.0 in | Wt 205.0 lb

## 2024-06-19 DIAGNOSIS — R3129 Other microscopic hematuria: Secondary | ICD-10-CM | POA: Diagnosis not present

## 2024-06-19 DIAGNOSIS — N200 Calculus of kidney: Secondary | ICD-10-CM

## 2024-06-19 DIAGNOSIS — N3281 Overactive bladder: Secondary | ICD-10-CM

## 2024-06-19 NOTE — Progress Notes (Signed)
   06/19/24  CC:  Chief Complaint  Patient presents with   Cysto    HPI: Refer to my previous office note 05/03/2024.  UA showed 3-10 RBC.  CT urogram 06/14/2024 showed nonobstructing nephrolithiasis 7 mm RLP, 5 mm LUP and bilateral simple renal cysts.  She was unable to give a urine specimen today but has no UTI symptoms.  She has bothersome OAB symptoms  Blood pressure 133/84, pulse 81, height 5' 7 (1.702 m), weight 205 lb (93 kg). NED. A&Ox3.   No respiratory distress   Abd soft, NT, ND Normal external genitalia with patent urethral meatus  Cystoscopy Procedure Note  Patient identification was confirmed, informed consent was obtained, and patient was prepped using Betadine solution.  Lidocaine  jelly was administered per urethral meatus.    Procedure: - Flexible cystoscope introduced, without any difficulty.   - Thorough search of the bladder revealed:    normal urethral meatus    normal urothelium    no stones    no ulcers     no tumors    no urethral polyps    no trabeculation  - Ureteral orifices were normal in position and appearance.  Post-Procedure: - Patient tolerated the procedure well  Assessment/ Plan: Microhematuria most likely secondary to nephrolithiasis No bladder mucosal abnormalities on cystoscopy She is not interested in medical management of her OAB symptoms.  We discussed pelvic floor physical therapy, Botox, PTNS and other neuromodulation.  She was provided literature on PTNS and may be interested in pursuing   Glendia JAYSON Barba, MD

## 2024-06-19 NOTE — Addendum Note (Signed)
 Addended by: DEBBY LAYMON HERO on: 06/19/2024 02:45 PM   Modules accepted: Orders

## 2024-06-22 LAB — COMP. METABOLIC PANEL (12)
AST: 36 IU/L (ref 0–40)
Albumin: 3.9 g/dL (ref 3.7–4.7)
Alkaline Phosphatase: 74 IU/L (ref 48–129)
BUN/Creatinine Ratio: 15 (ref 12–28)
BUN: 14 mg/dL (ref 8–27)
Bilirubin Total: 0.5 mg/dL (ref 0.0–1.2)
Calcium: 9.8 mg/dL (ref 8.7–10.3)
Chloride: 99 mmol/L (ref 96–106)
Creatinine, Ser: 0.96 mg/dL (ref 0.57–1.00)
Globulin, Total: 3 g/dL (ref 1.5–4.5)
Glucose: 113 mg/dL — ABNORMAL HIGH (ref 70–99)
Potassium: 4.6 mmol/L (ref 3.5–5.2)
Sodium: 141 mmol/L (ref 134–144)
Total Protein: 6.9 g/dL (ref 6.0–8.5)
eGFR: 59 mL/min/1.73 — ABNORMAL LOW (ref >59)

## 2024-06-22 LAB — 25-HYDROXY VITAMIN D LCMS D2+D3
25-Hydroxy, Vitamin D-2: <1 ng/mL
25-Hydroxy, Vitamin D-3: 51 ng/mL
25-Hydroxy, Vitamin D: 51 ng/mL

## 2024-06-22 LAB — MAGNESIUM: Magnesium: 2 mg/dL (ref 1.6–2.3)

## 2024-06-22 LAB — C-REACTIVE PROTEIN: CRP: 17 mg/L — ABNORMAL HIGH (ref 0–10)

## 2024-06-22 LAB — VITAMIN B12: Vitamin B-12: 677 pg/mL (ref 232–1245)

## 2024-06-22 LAB — SEDIMENTATION RATE: Sed Rate: 71 mm/h — ABNORMAL HIGH (ref 0–40)

## 2024-06-28 ENCOUNTER — Ambulatory Visit: Admitting: Pain Medicine

## 2024-06-29 ENCOUNTER — Encounter: Payer: Self-pay | Admitting: Pain Medicine

## 2024-06-29 ENCOUNTER — Ambulatory Visit: Attending: Pain Medicine | Admitting: Pain Medicine

## 2024-06-29 VITALS — BP 125/66 | HR 70 | Temp 97.5°F | Resp 16 | Ht 67.0 in | Wt 205.0 lb

## 2024-06-29 DIAGNOSIS — Z950 Presence of cardiac pacemaker: Secondary | ICD-10-CM | POA: Insufficient documentation

## 2024-06-29 DIAGNOSIS — M5417 Radiculopathy, lumbosacral region: Secondary | ICD-10-CM | POA: Diagnosis present

## 2024-06-29 DIAGNOSIS — M79604 Pain in right leg: Secondary | ICD-10-CM | POA: Insufficient documentation

## 2024-06-29 DIAGNOSIS — M4804 Spinal stenosis, thoracic region: Secondary | ICD-10-CM | POA: Diagnosis not present

## 2024-06-29 DIAGNOSIS — M545 Low back pain, unspecified: Secondary | ICD-10-CM | POA: Insufficient documentation

## 2024-06-29 DIAGNOSIS — M4310 Spondylolisthesis, site unspecified: Secondary | ICD-10-CM | POA: Insufficient documentation

## 2024-06-29 DIAGNOSIS — G8929 Other chronic pain: Secondary | ICD-10-CM | POA: Insufficient documentation

## 2024-06-29 DIAGNOSIS — Z7901 Long term (current) use of anticoagulants: Secondary | ICD-10-CM | POA: Diagnosis not present

## 2024-06-29 DIAGNOSIS — M549 Dorsalgia, unspecified: Secondary | ICD-10-CM | POA: Insufficient documentation

## 2024-06-29 DIAGNOSIS — M5134 Other intervertebral disc degeneration, thoracic region: Secondary | ICD-10-CM | POA: Insufficient documentation

## 2024-06-29 DIAGNOSIS — R937 Abnormal findings on diagnostic imaging of other parts of musculoskeletal system: Secondary | ICD-10-CM | POA: Insufficient documentation

## 2024-06-29 DIAGNOSIS — R319 Hematuria, unspecified: Secondary | ICD-10-CM | POA: Diagnosis not present

## 2024-06-29 DIAGNOSIS — M51362 Other intervertebral disc degeneration, lumbar region with discogenic back pain and lower extremity pain: Secondary | ICD-10-CM | POA: Diagnosis not present

## 2024-06-29 DIAGNOSIS — R3989 Other symptoms and signs involving the genitourinary system: Secondary | ICD-10-CM | POA: Diagnosis not present

## 2024-06-29 NOTE — Patient Instructions (Addendum)
 ______________________________________________________________________    Procedure instructions  Stop blood-thinners  Do not eat or drink fluids (other than water) for 6 hours before your procedure  No water for 2 hours before your procedure  Take your blood pressure medicine with a sip of water  Arrive 30 minutes before your appointment  If sedation is planned, bring suitable driver. [place], [place], & public transportation are NOT APPROVED)  Carefully read the Preparing for your procedure detailed instructions  If you have questions call us  at [phone]  Procedure appointments are for procedures only.   NO medication refills or new problem evaluations will be done on procedure days.   Only the scheduled, pre-approved procedure and side will be done.   ______________________________________________________________________     ______________________________________________________________________    Preparing for your procedure  Appointments: If you think you may not be able to keep your appointment, call 24-48 hours in advance to cancel. We need time to make it available to others.  Procedure visits are for procedures only. During your procedure appointment there will be: NO Prescription Refills*. NO medication changes or discussions*. NO discussion of disability issues*. NO unrelated pain problem evaluations*. NO evaluations to order other pain procedures*. *These will be addressed at a separate and distinct evaluation encounter on the provider's evaluation schedule and not during procedure days.  Instructions: Food intake: Avoid eating anything solid for at least 8 hours prior to your procedure. Clear liquid intake: You may take clear liquids such as water up to 2 hours prior to your procedure. (No carbonated drinks. No soda.) Transportation: Unless otherwise stated by your physician, bring a driver. (Driver cannot be a Market Researcher, Pharmacist, Community, or any other form of public  transportation.) Morning Medicines: Except for blood thinners, take all of your other morning medications with a sip of water. Make sure to take your heart and blood pressure medicines. If your blood pressure's lower number is above 100, the case will be rescheduled. Blood thinners: Make sure to stop your blood thinners as instructed.  If you take a blood thinner, but were not instructed to stop it, call our office [phone] and ask to talk to a nurse. Not stopping a blood thinner prior to certain procedures could lead to serious complications. Diabetics on insulin: Notify the staff so that you can be scheduled 1st case in the morning. If your diabetes requires high dose insulin, take only  of your normal insulin dose the morning of the procedure and notify the staff that you have done so. Preventing infections: Shower with an antibacterial soap the morning of your procedure.  Build-up your immune system: Take 1000 mg of Vitamin C with every meal (3 times a day) the day prior to your procedure. Antibiotics: Inform the nursing staff if you are taking any antibiotics or if you have any conditions that may require antibiotics prior to procedures. (Example: recent joint implants)   Pregnancy: If you are pregnant make sure to notify the nursing staff. Not doing so may result in injury to the fetus, including death.  Sickness: If you have a cold, fever, or any active infections, call and cancel or reschedule your procedure. Receiving steroids while having an infection may result in complications. Arrival: You must be in the facility at least 30 minutes prior to your scheduled procedure. Tardiness: Your scheduled time is also the cutoff time. If you do not arrive at least 15 minutes prior to your procedure, you will be rescheduled.  Children: Do not bring any children with  you. Make arrangements to keep them home. Dress appropriately: There is always a possibility that your clothing may get soiled. Avoid  long dresses. Valuables: Do not bring any jewelry or valuables.  Reasons to call and reschedule or cancel your procedure: (Following these recommendations will minimize the risk of a serious complication.) Surgeries: Avoid having procedures within 2 weeks of any surgery. (Avoid for 2 weeks before or after any surgery). Flu Shots: Avoid having procedures within 2 weeks of a flu shots or . (Avoid for 2 weeks before or after immunizations). Barium: Avoid having a procedure within 7-10 days after having had a radiological study involving the use of radiological contrast. (Myelograms, Barium swallow or enema study). Heart attacks: Avoid any elective procedures or surgeries for the initial 6 months after a Myocardial Infarction (Heart Attack). Blood thinners: It is imperative that you stop these medications before procedures. Let us  know if you if you take any blood thinner.  Infection: Avoid procedures during or within two weeks of an infection (including chest colds or gastrointestinal problems). Symptoms associated with infections include: Localized redness, fever, chills, night sweats or profuse sweating, burning sensation when voiding, cough, congestion, stuffiness, runny nose, sore throat, diarrhea, nausea, vomiting, cold or Flu symptoms, recent or current infections. It is specially important if the infection is over the area that we intend to treat. Heart and lung problems: Symptoms that may suggest an active cardiopulmonary problem include: cough, chest pain, breathing difficulties or shortness of breath, dizziness, ankle swelling, uncontrolled high or unusually low blood pressure, and/or palpitations. If you are experiencing any of these symptoms, cancel your procedure and contact your primary care physician for an evaluation.  Remember:  Regular Business hours are:  Monday to Thursday 8:00 AM to 4:00 PM  Provider's Schedule: Eric Como, MD:  Procedure days: Tuesday and Thursday 7:30  AM to 4:00 PM  Wallie Sherry, MD:  Procedure days: Monday and Wednesday 7:30 AM to 4:00 PM Last  Updated: 07/06/2023 ______________________________________________________________________     ______________________________________________________________________    General Risks and Possible Complications  Patient Responsibilities: It is important that you read this as it is part of your informed consent. It is our duty to inform you of the risks and possible complications associated with treatments offered to you. It is your responsibility as a patient to read this and to ask questions about anything that is not clear or that you believe was not covered in this document.  Patient's Rights: You have the right to refuse treatment. You also have the right to change your mind, even after initially having agreed to have the treatment done. However, under this last option, if you wait until the last second to change your mind, you may be charged for the materials used up to that point.  Introduction: Medicine is not an visual merchandiser. Everything in Medicine, including the lack of treatment(s), carries the potential for danger, harm, or loss (which is by definition: Risk). In Medicine, a complication is a secondary problem, condition, or disease that can aggravate an already existing one. All treatments carry the risk of possible complications. The fact that a side effects or complications occurs, does not imply that the treatment was conducted incorrectly. It must be clearly understood that these can happen even when everything is done following the highest safety standards.  No treatment: You can choose not to proceed with the proposed treatment alternative. The "PRO(s)" would include: avoiding the risk of complications associated with the therapy. The "CON(s)" would include:  not getting any of the treatment benefits. These benefits fall under one of three categories: diagnostic; therapeutic; and/or  palliative. Diagnostic benefits include: getting information which can ultimately lead to improvement of the disease or symptom(s). Therapeutic benefits are those associated with the successful treatment of the disease. Finally, palliative benefits are those related to the decrease of the primary symptoms, without necessarily curing the condition (example: decreasing the pain from a flare-up of a chronic condition, such as incurable terminal cancer).  General Risks and Complications: These are associated to most interventional treatments. They can occur alone, or in combination. They fall under one of the following six (6) categories: no benefit or worsening of symptoms; bleeding; infection; nerve damage; allergic reactions; and/or death. No benefits or worsening of symptoms: In Medicine there are no guarantees, only probabilities. No healthcare provider can ever guarantee that a medical treatment will work, they can only state the probability that it may. Furthermore, there is always the possibility that the condition may worsen, either directly, or indirectly, as a consequence of the treatment. Bleeding: This is more common if the patient is taking a blood thinner, either prescription or over the counter (example: Goody Powders, Fish oil, Aspirin, Garlic, etc.), or if suffering a condition associated with impaired coagulation (example: Hemophilia, cirrhosis of the liver, low platelet counts, etc.). However, even if you do not have one on these, it can still happen. If you have any of these conditions, or take one of these drugs, make sure to notify your treating physician. Infection: This is more common in patients with a compromised immune system, either due to disease (example: diabetes, cancer, human immunodeficiency virus [HIV], etc.), or due to medications or treatments (example: therapies used to treat cancer and rheumatological diseases). However, even if you do not have one on these, it can still  happen. If you have any of these conditions, or take one of these drugs, make sure to notify your treating physician. Nerve Damage: This is more common when the treatment is an invasive one, but it can also happen with the use of medications, such as those used in the treatment of cancer. The damage can occur to small secondary nerves, or to large primary ones, such as those in the spinal cord and brain. This damage may be temporary or permanent and it may lead to impairments that can range from temporary numbness to permanent paralysis and/or brain death. Allergic Reactions: Any time a substance or material comes in contact with our body, there is the possibility of an allergic reaction. These can range from a mild skin rash (contact dermatitis) to a severe systemic reaction (anaphylactic reaction), which can result in death. Death: In general, any medical intervention can result in death, most of the time due to an unforeseen complication. ______________________________________________________________________      ______________________________________________________________________    Steroid injections  Common steroids for injections Triamcinolone: Used by many sports medicine physicians for large joint and bursal injections, often combined with a local anesthetic like lidocaine . A study focusing on coccydynia (tailbone pain) found triamcinolone was more effective than betamethasone, suggesting it may also be preferable for other localized inflammation conditions. Methylprednisolone: A common alternative to triamcinolone that is also a strong anti-inflammatory. It is available in different formulations, with the acetate suspension being the long-acting option for intra-articular injections. Dexamethasone : This is a non-particulate steroid, meaning it has a lower risk of tissue damage compared to particulate steroids like triamcinolone and methylprednisolone. While less common for this specific  use,  it is an option for targeted injections.   Considerations for physicians Particulate vs. non-particulate steroids: Triamcinolone and methylprednisolone are particulate, meaning they can clump together. Dexamethasone  is non-particulate. Particulate steroids are often preferred for their longer-lasting effects but carry a theoretical higher risk for certain injections (though this is less of a concern in the costochondral joints). Combined injectate: Corticosteroids are typically mixed with a local anesthetic like lidocaine  to provide both immediate pain relief (from the anesthetic) and longer-term inflammation reduction (from the steroid). Imaging guidance: To ensure accurate placement of the needle and medication, physicians may use ultrasound or fluoroscopic guidance for the injection, especially in complex or refractory cases.   Patient guidance Before undergoing a steroid injection, discuss the options with your physician. They will determine the best steroid, dosage, and procedure for your specific case based on factors like: Severity of your condition History of response to other treatments Your overall health status Experience and preference of the physician  Last  Updated: 03/21/2024 ______________________________________________________________________     ______________________________________________________________________    Blood Thinners  IMPORTANT NOTICE:  If you take any of these, make sure to notify the nursing staff.  Failure to do so may result in serious injury.  Recommended time intervals to stop and restart blood-thinners, before & after invasive procedures  Generic Name Brand Name Pre-procedure: Stop medication for this amount of time before your procedure: Post-procedure: Wait this amount of time after the procedure before restarting your medication:  Abciximab Reopro 15 days 2 hrs  Alteplase Activase 10 days 10 days  Anagrelide Agrylin    Apixaban Eliquis  3 days 6 hrs  Cilostazol Pletal 3 days 5 hrs  Clopidogrel Plavix 7-10 days 2 hrs  Dabigatran Pradaxa 5 days 6 hrs  Dalteparin Fragmin 24 hours 4 hrs  Dipyridamole Aggrenox 11days 2 hrs  Edoxaban Lixiana; Savaysa 3 days 2 hrs  Enoxaparin  Lovenox 24 hours 4 hrs  Eptifibatide Integrillin 8 hours 2 hrs  Fondaparinux  Arixtra 72 hours 12 hrs  Hydroxychloroquine Plaquenil 11 days   Prasugrel Effient 7-10 days 6 hrs  Reteplase Retavase 10 days 10 days  Rivaroxaban Xarelto 3 days 6 hrs  Ticagrelor Brilinta 5-7 days 6 hrs  Ticlopidine Ticlid 10-14 days 2 hrs  Tinzaparin Innohep 24 hours 4 hrs  Tirofiban Aggrastat 8 hours 2 hrs  Warfarin Coumadin  5 days 2 hrs   Other medications with blood-thinning effects  NOTE: Consider stopping these if you have prolonged bleeding despite not taking any of the above blood thinners. Otherwise ask your provider and this will be decided on a case-by-case basis.  Product indications Generic (Brand) names Note  Cholesterol Lipitor Stop 4 days before procedure  Blood thinner (injectable) Heparin (LMW or LMWH Heparin) Stop 24 hours before procedure  Cancer Ibrutinib (Imbruvica) Stop 7 days before procedure  Malaria/Rheumatoid Hydroxychloroquine (Plaquenil) Stop 11 days before procedure  Thrombolytics  10 days before or after procedures   Over-the-counter (OTC) Products with blood-thinning effects  NOTE: Consider stopping these if you have prolonged bleeding despite not taking any of the above blood thinners. Otherwise ask your provider and this will be decided on a case-by-case basis.  Product Common names Stop Time  Aspirin > 325 mg Goody Powders, Excedrin, etc. 11 days  Aspirin <= 81 mg  7 days  Fish oil  4 days  Garlic supplements  7 days  Ginkgo biloba  36 hours  Ginseng  24 hours  NSAIDs Ibuprofen, Naprosyn, etc. 3 days  Vitamin E  4  days   ______________________________________________________________________      ______________________________________________________________________    General Risks and Possible Complications  Patient Responsibilities: It is important that you read this as it is part of your informed consent. It is our duty to inform you of the risks and possible complications associated with treatments offered to you. It is your responsibility as a patient to read this and to ask questions about anything that is not clear or that you believe was not covered in this document.  Patient's Rights: You have the right to refuse treatment. You also have the right to change your mind, even after initially having agreed to have the treatment done. However, under this last option, if you wait until the last second to change your mind, you may be charged for the materials used up to that point.  Introduction: Medicine is not an visual merchandiser. Everything in Medicine, including the lack of treatment(s), carries the potential for danger, harm, or loss (which is by definition: Risk). In Medicine, a complication is a secondary problem, condition, or disease that can aggravate an already existing one. All treatments carry the risk of possible complications. The fact that a side effects or complications occurs, does not imply that the treatment was conducted incorrectly. It must be clearly understood that these can happen even when everything is done following the highest safety standards.  No treatment: You can choose not to proceed with the proposed treatment alternative. The "PRO(s)" would include: avoiding the risk of complications associated with the therapy. The "CON(s)" would include: not getting any of the treatment benefits. These benefits fall under one of three categories: diagnostic; therapeutic; and/or palliative. Diagnostic benefits include: getting information which can ultimately lead to improvement of the disease or symptom(s). Therapeutic benefits are those associated with the successful  treatment of the disease. Finally, palliative benefits are those related to the decrease of the primary symptoms, without necessarily curing the condition (example: decreasing the pain from a flare-up of a chronic condition, such as incurable terminal cancer).  General Risks and Complications: These are associated to most interventional treatments. They can occur alone, or in combination. They fall under one of the following six (6) categories: no benefit or worsening of symptoms; bleeding; infection; nerve damage; allergic reactions; and/or death. No benefits or worsening of symptoms: In Medicine there are no guarantees, only probabilities. No healthcare provider can ever guarantee that a medical treatment will work, they can only state the probability that it may. Furthermore, there is always the possibility that the condition may worsen, either directly, or indirectly, as a consequence of the treatment. Bleeding: This is more common if the patient is taking a blood thinner, either prescription or over the counter (example: Goody Powders, Fish oil, Aspirin, Garlic, etc.), or if suffering a condition associated with impaired coagulation (example: Hemophilia, cirrhosis of the liver, low platelet counts, etc.). However, even if you do not have one on these, it can still happen. If you have any of these conditions, or take one of these drugs, make sure to notify your treating physician. Infection: This is more common in patients with a compromised immune system, either due to disease (example: diabetes, cancer, human immunodeficiency virus [HIV], etc.), or due to medications or treatments (example: therapies used to treat cancer and rheumatological diseases). However, even if you do not have one on these, it can still happen. If you have any of these conditions, or take one of these drugs, make sure to notify your treating physician. Nerve  Damage: This is more common when the treatment is an invasive one, but it  can also happen with the use of medications, such as those used in the treatment of cancer. The damage can occur to small secondary nerves, or to large primary ones, such as those in the spinal cord and brain. This damage may be temporary or permanent and it may lead to impairments that can range from temporary numbness to permanent paralysis and/or brain death. Allergic Reactions: Any time a substance or material comes in contact with our body, there is the possibility of an allergic reaction. These can range from a mild skin rash (contact dermatitis) to a severe systemic reaction (anaphylactic reaction), which can result in death. Death: In general, any medical intervention can result in death, most of the time due to an unforeseen complication. ______________________________________________________________________   ______________________________________________________________________    TENS (Device can be purchased online, without prescription. Search: TENS 7000.) Transcutaneous electrical nerve stimulation (TENS) is a method of pain relief that involves the use of mild electrical stimulation. A TENS machine is a small, battery-operated device that has leads connected to sticky pads called electrodes. Available at [employer]. (Estimated price as of July 10th, 2025.)  (Estimated [employer] cost: $38.88) Rechargeable 9V batteries:  (Estimated [employer] cost: $12.98)  Larger Reusable 2 x 4 TENS Pads/Electrodes:  (Estimated [employer] cost: $9.99)  Total cost: $61.85    ELECTRODE PLACEMENT:   TENS UNIT SAFETY WARNING SHEET and INFORMATION INDICATIONS AND CONTRAINDICTIONS Read the operation manual before using the device. Freight forwarder ([place] ) restricts this device to sale by or on the order of a physician. Observe your physician's precise instructions and let him show you where to apply the electrodes. For a successful therapy, the correct application of the electrodes is an important factor. Carefully  write down the settings your physician recommended. Indications for use This device is a prescription device and only for symptomatic relief of chronic intractable pain.  Contraindications:   Any electrode placement that applies current to the carotid sinus (neck) region.   Patients with implanted electronic devices (for example, a pacemaker) or metallic implants should not undertake.   Any electrode placement that causes current to flow transcerebrally (through the head). The use of unit whenever pain symptoms are undiagnosed, unit etiology is determined.   The use of TENS whenever pain syndromes are undiagnosed, until etiology is established.   WARNINGS AND PRECAUTIONS  Warnings:   The device must be kept out of reach of children.   The safety of device for use during pregnancy or delivery has not been established.   Do not place electrodes on front of the throat. This may result in spasms of the laryngeal and pharyngeal muscles.   Do not place the electrodes over the carotid nerve (side of neck below ear).   The device is not effective for pain of central origin (headaches).   The device may interfere with electronic monitoring equipment (such as ECG monitors and ECG alarms).   Electrodes should not be placed over the eyes, in the mouth, or internally.   These devices have no curative value.   TENS devices should be used only under the continued supervision of a physician.   TENS is a symptomatic treatment and as such suppresses the sensation of pain which would otherwise serve as a protective mechanism. Precautions/Adverse Reactions   Isolated cases of skin irritation may occur at the site of electrode placement following long-term application.   Stimulation should be stopped and electrodes  removed until the cause of the irritation can be determined.   Effectiveness is highly dependent upon patient selection by a person qualified in the management of pain patients.   If the device treatment becomes  ineffective or unpleasant, stimulation should be discontinued until reevaluation by a physician/clinician.   Always turn the device off before applying or removing electrodes.   Skin irritation and electrode burns are potential adverse reactions.  PURPOSE: A Transcutaneous Electrical Nerve Stimulator, or TENS, unit is designed to relieve post-operative, acute and chronic pain. It is used for pain caused by peripheral nerves and not central. TENS units are prescription-only devices.   OPERATION: TENS units work in a couple of ways. The first way they are thought to work is by a method called the [employer]. The [employer] states that our brains can only handle one stimulus at a time. When you have chronic pain, this pain signal is constantly being sent to your brain and recognized as pain. When an electrical stimulus is added to the area of pain the body feels this electrical stimulus, and since the brain can only handle one thing at a time, the pain is not transmitted to the brain. The second method thought to be part of TENS unit's success is by way of stimulating our own bodies to release their own natural painkillers. TENS units do not work for everyone and results may vary. Always follow the instructions and warnings in your user's manual.   USE: One of the most important tasks that must be performed is battery maintenance. If you are using a engineering geologist, always fully charge it and fully deplete it before charging it again. These batteries can develop memories and by not performing this charging task correctly, your battery's life can be greatly diminished. If your battery does develop a memory you can help expand the memory by charging for 12 - 13 hours and then completely depleting the battery. Always prepare the skin before applying electrodes. Your skin should be clean and free of any lotions or creams. If you are using electrodes that use conductive gel, apply a small, even  layer over the electrode. For carbon, self-adhesive electrodes, apply a drop of water to the electrodes before applying to the skin. The electrodes attach to the lead wires and then the TENS unit. Always grasp the connector and not the cord when inserting or removing. When making adjustments, always make sure the unit's channels (1 and 2) are in the OFF position. The actual settings should be recommended and prescribed by your physician. Medical equipment suppliers don'tset or instruct users as to user settings. When you are using the BURST mode, the unit delivers a series of quick pulses followed by a rest. This cycle repeats itself frequently. Always have channels OFF before changing modes.   For MODULATION mode, the stimulation automatically varies the width of the pulse.   For CONVENTIONAL mode, the stimulation is constant. After the settings have been fine-tuned, set the timer to 30 or 60 minutes. Your physician should also prescribe the use time. When the lights become dim, it means your batteries should be replaced or recharged.   ACCESSORIES: The electrodes and lead wires can be obtained from your medical equipment supplier. Your medical equipment supplier can set up a recurring delivery to accommodate your needs. Electrodes should be replaced once a month and lead wires once every 6 [email or web address]  Video Tutorial [email or web address]    ______________________________________________________________________

## 2024-06-29 NOTE — Progress Notes (Signed)
 PROVIDER NOTE: Interpretation of information contained herein should be left to medically-trained personnel. Specific patient instructions are provided elsewhere under Patient Instructions section of medical record. This document was created in part using AI and STT-dictation technology, any transcriptional errors that may result from this process are unintentional.  Patient: Kelly Olsen  Service: E/M   PCP: Cyrus Selinda Moose, PA-C  DOB: 08-27-1940  DOS: 06/29/2024  Provider: Eric DELENA Como, MD  MRN: [id number]  Delivery: Face-to-face  Specialty: Interventional Pain Management  Type: Established Patient  Setting: Ambulatory outpatient facility  Specialty designation: 09  Referring Prov.: Cyrus Selinda Moose,*  Location: Outpatient office facility       Primary Reason(s) for Visit: Encounter for evaluation before starting new chronic pain management plan of care (Level of risk: moderate) CC: Back Pain (lower)  HPI  Kelly Olsen is a 83 y.o. year old, female patient, who comes today for a follow-up evaluation to review the test results and decide on a treatment plan. She has Vertigo, intractable; Chronic obstructive pulmonary disease (HCC); Chronic atrial fibrillation (HCC); SA node dysfunction s/p pacemaker (HCC); Long term current use of anticoagulant; Presence of permanent cardiac pacemaker; H/O tricuspid valve repair; Polymyalgia rheumatica syndrome; Age-related osteoporosis without current pathological fracture; Asthma; Eosinophilic asthma; Atopic IgE mediated allergic disorder; Carpal tunnel syndrome, left; Carpal tunnel syndrome, right; Congestive heart failure (HCC); Edema; Essential hypertension; Gastro-esophageal reflux disease with esophagitis; Long term current use of systemic steroids; Medicare annual wellness visit, initial; Osteoarthritis of left knee; Primary osteoarthritis involving multiple joints; Severe tricuspid valve insufficiency; Venous insufficiency of both lower  extremities; Persistent atrial fibrillation (HCC); Chronic pain syndrome; Pharmacologic therapy; Disorder of skeletal system; Problems influencing health status; Abnormal CT scan, lumbar spine (05/30/2024); Abnormal CT of thoracic spine (05/30/2024); Abnormal x-ray of lumbar spine (05/11/2024); Chronic anticoagulation (Coumadin ); Cardiac pacemaker in situ; Chronic low back pain (1ry area of Pain) (Bilateral) (R>L) w/o sciatica (Intermittent); Chronic upper back pain (2ry area of Pain) (Bilateral); Chronic lower extremity pain (3ry area of Pain) (Right) (intermittent); 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 Her primarily concern today is the Back Pain (lower)  Pain Assessment: Location: Lower Back Radiating: right leg to the foot Onset: More than a month ago Duration: Chronic pain Quality: Sharp Severity: 10-Worst pain ever/10 (subjective, self-reported pain score)  Effect on ADL:   Timing: Constant Modifying factors: nothing BP: 125/66  HR: 70  Kelly Olsen comes in today for a follow-up visit after her initial evaluation on 06/13/2024. Today we went over the results of her tests. These were explained in Layman's terms. During today's appointment we went over my diagnostic impression, as well as the proposed treatment plan.  Review of initial evaluation (06/13/2024): Kelly Olsen is an 83 year old female with chronic lower back pain who presents for evaluation of a possible spinal cord stimulator trial. She was referred by Dr. Katrina for evaluation of a possible spinal cord stimulator trial.   She experiences intermittent lower back pain, primarily across the back with occasional right-sided predominance, extending to the thoracic region below the shoulder blades. No abdominal  pain or prior back surgeries. A spinal injection at [facility] clinic did not relieve her pain, and physical therapy worsened her condition.   She is on prednisone for polymyalgia rheumatica, which helps control muscle pain in her head, arms, and shoulders. She takes gabapentin  for neuropathy  in her feet, diagnosed seven to eight years ago, with no side effects. No diabetes, chemotherapy, or vitamin B12 deficiencies.   She is on Coumadin  for atrial fibrillation, with a history of open heart surgery for tricuspid valve repair and a pacemaker. She has stopped Coumadin  for procedures in the past without issues.   Review of diagnostic test ordered on 06/13/2024:  Diagnostic lab work: Elevated CRP, elevated sed rate, with elevated blood glucose level of 113 mg/dL (normal 29-00), and decreased eGFR of 59 mL/min/1.73.  Vitamin D  levels, magnesium, and vitamin B12 levels were all within normal limits. Diagnostic imaging: No further imaging studies ordered.  The patient comes into the clinic today indicating having a flareup of her right lower extremity pain with pain that seems to follow the distribution of L2 as well as some referred pain going down the leg.  Today I had a very long conversation with the patient where I went over the results of her thoracic and lumbar CT scans as well as the x-rays on flexion and extension that demonstrated instability of her lumbar spine.  I answered all of her questions regarding the spinal cord stimulator and I have explained to the patient that based on her anatomy it would be technically difficult to start the trial spinal cord stimulator.  I also explained to the patient that because of the nature of her instability, her symptoms would be changing depending on her movements.  At this point she seems to be having a radiculitis/radiculopathy affecting the right L2 nerve root and therefore I have offered her a right sided L2-3 LESI, which she has decided to have done until she  decides on whether or not to proceed with the spinal cord stimulator trial.  Patient presented with interventional treatment options. Kelly Olsen was informed that I will not be providing medication management. Pharmacotherapy evaluation including recommendations may be offered, if specifically requested.   Controlled Substance Pharmacotherapy Assessment REMS (Risk Evaluation and Mitigation Strategy)  Opioid Analgesic: None MME/day: 0 mg/day   Pill Count: None expected due to no prior prescriptions written by our practice. No notes on file  Pharmacokinetics: Liberation and absorption (onset of action): WNL Distribution (time to peak effect): WNL Metabolism and excretion (duration of action): WNL         Pharmacodynamics: Desired effects: Analgesia: Kelly Olsen reports >50% benefit. Functional ability: Patient reports that medication allows her to accomplish basic ADLs Clinically meaningful improvement in function (CMIF): Sustained CMIF goals met Perceived effectiveness: Described as relatively effective, allowing for increase in activities of daily living (ADL) Undesirable effects: Side-effects or Adverse reactions: None reported Monitoring: [place] PMP: PDMP not reviewed this encounter. Online review of the past 22-month period previously conducted. Not applicable at this point since we have not taken over the patient's medication management yet. List of other Serum/Urine Drug Screening Test(s):  No results found for: AMPHSCRSER, BARBSCRSER, BENZOSCRSER, COCAINSCRSER, COCAINSCRNUR, PCPSCRSER, THCSCRSER, THCU, CANNABQUANT, OPIATESCRSER, OXYSCRSER, PROPOXSCRSER, ETH, CBDTHCR, D8THCCBX, D9THCCBX List of all UDS test(s) done:  No results found for: TOXASSSELUR, SUMMARY Last UDS on record: No results found for: TOXASSSELUR, SUMMARY UDS interpretation: No unexpected findings.          Medication Assessment Form: Not applicable. No opioids. Treatment  compliance: Not applicable Risk Assessment Profile: Aberrant behavior: See initial evaluations. None observed or detected today Comorbid factors increasing risk of overdose: See initial evaluation. No additional risks detected today Opioid risk tool (ORT):      No data to display  ORT Scoring interpretation table:  Score <3 = Low Risk for SUD  Score between 4-7 = Moderate Risk for SUD  Score >8 = High Risk for Opioid Abuse   Risk of substance use disorder (SUD): Low  Risk Mitigation Strategies:  Patient opioid safety counseling: No controlled substances prescribed. Patient-Prescriber Agreement (PPA): No agreement signed.  Controlled substance notification to other providers: None required. No opioid therapy.  Pharmacologic Plan: Non-opioid analgesic therapy offered. Interventional alternatives discussed.             Laboratory Chemistry Profile   Renal Lab Results  Component Value Date   BUN 14 06/13/2024   CREATININE 0.96 06/13/2024   BCR 15 06/13/2024   GFRAA >60 02/07/2015   GFRNONAA >60 04/20/2024   SPECGRAV 1.010 05/03/2024   PHUR 6.0 05/03/2024   PROTEINUR Negative 05/03/2024     Electrolytes Lab Results  Component Value Date   NA 141 06/13/2024   K 4.6 06/13/2024   CL 99 06/13/2024   CALCIUM 9.8 06/13/2024   MG 2.0 06/13/2024     Hepatic Lab Results  Component Value Date   AST 36 06/13/2024   ALT 21 04/20/2024   ALBUMIN 3.9 06/13/2024   ALKPHOS 74 06/13/2024     ID No results found for: LYMEIGGIGMAB, HIV, SARSCOV2NAA, STAPHAUREUS, MRSAPCR, HCVAB, PREGTESTUR, RMSFIGG, QFVRPH1IGG, QFVRPH2IGG   Bone Lab Results  Component Value Date   25OHVITD1 51 06/13/2024   25OHVITD2 <1.0 06/13/2024   25OHVITD3 51 06/13/2024     Endocrine Lab Results  Component Value Date   GLUCOSE 113 (H) 06/13/2024   GLUCOSEU Negative 05/03/2024     Neuropathy Lab Results  Component Value Date   VITAMINB12 677 06/13/2024     CNS No  results found for: COLORCSF, APPEARCSF, RBCCOUNTCSF, WBCCSF, POLYSCSF, LYMPHSCSF, EOSCSF, PROTEINCSF, GLUCCSF, JCVIRUS, CSFOLI, IGGCSF, LABACHR, ACETBL   Inflammation (CRP: Acute  ESR: Chronic) Lab Results  Component Value Date   CRP 17 (H) 06/13/2024   ESRSEDRATE 71 (H) 06/13/2024     Rheumatology No results found for: RF, ANA, LABURIC, URICUR, LYMEIGGIGMAB, LYMEABIGMQN, HLAB27   Coagulation Lab Results  Component Value Date   INR 2.2 (H) 04/22/2024   LABPROT 25.5 (H) 04/22/2024   PLT 380 04/20/2024   DDIMER 0.37 01/22/2021     Cardiovascular Lab Results  Component Value Date   TROPONINI <0.03 02/07/2015   HGB 12.9 04/20/2024   HCT 39.4 04/20/2024     Screening No results found for: SARSCOV2NAA, COVIDSOURCE, STAPHAUREUS, MRSAPCR, HCVAB, HIV, PREGTESTUR   Cancer No results found for: CEA, CA125, LABCA2   Allergens No results found for: ALMOND, APPLE, ASPARAGUS, AVOCADO, BANANA, BARLEY, BASIL, BAYLEAF, GREENBEAN, LIMABEAN, WHITEBEAN, BEEFIGE, REDBEET, BLUEBERRY, BROCCOLI, CABBAGE, MELON, CARROT, CASEIN, CASHEWNUT, CAULIFLOWER, CELERY     Note: Lab results reviewed.  Recent Diagnostic Imaging Review  Shoulder Imaging: Shoulder-L DG: Results for orders placed during the hospital encounter of 04/07/24 DG Shoulder Left  Narrative CLINICAL DATA:  Two falls yesterday.  EXAM: LEFT SHOULDER - 2+ VIEW  COMPARISON:  None Available.  FINDINGS: No acute fracture or dislocation of the left shoulder. Mild glenohumeral and acromioclavicular joint osteoarthritis. Partially visualized left-sided pacemaker. Redemonstrated chronic discontinuity of superior most median sternotomy wire.  IMPRESSION: 1. No acute fracture or dislocation of the left shoulder. 2.  Mild glenohumeral and acromioclavicular joint osteoarthritis.   Electronically Signed By: Harrietta Sherry  M.D. On: 04/07/2024 11:23  Lumbosacral Imaging: Lumbar CT wo contrast: Results for orders placed during the hospital encounter of  12/08/23 CT LUMBAR SPINE WO CONTRAST  Narrative CLINICAL DATA:  Lumbar radiculitis, acute on chronic lower back pain with radiation of pain into the right groin and anterior right thigh and knee.  EXAM: CT LUMBAR SPINE WITHOUT CONTRAST  TECHNIQUE: Multidetector CT imaging of the lumbar spine was performed without intravenous contrast administration. Multiplanar CT image reconstructions were also generated.  RADIATION DOSE REDUCTION: This exam was performed according to the departmental dose-optimization program which includes automated exposure control, adjustment of the mA and/or kV according to patient size and/or use of iterative reconstruction technique.  COMPARISON:  CT renal stone protocol 04/10/2022.  FINDINGS: Segmentation: There are 6 non-rib-bearing lumbar type vertebral bodies labeled L1 through L6. The lowest well-formed disc space is labeled as L6-S1. Review of CT chest from 07/03/2021 confirms presence of 12 rib-bearing thoracic type vertebra.  Alignment: Lumbar lordosis is maintained. There is 3 mm retrolisthesis of L3 on L4. Subtle levocurvature in the lower lumbar spine.  Vertebrae: No compression fracture or displaced fracture in the lumbar spine. Degenerative endplate changes at multiple levels. There is partial fusion of the L2 and L3 vertebral along the left aspect. No suspicious lytic or blastic osseous lesion. Sclerotic focus in the right sacral ala is unchanged since 2023.  Paraspinal and other soft tissues: The paraspinal soft tissues are unremarkable. Nonobstructing renal calculi on the left measuring up to 0.4 cm. Additional calcified focus within the right renal parenchyma measuring up to 0.6 cm. 2.6 cm right renal cyst, no follow-up required. 0.5 slightly hyperattenuating focus within the anterior aspect of the  left kidney.  Disc levels:  T12-L1: Visualized on sagittal images without significant spinal canal stenosis. Foramina are partially visualized without evidence of high-grade stenosis.  L1-2: Mild disc space narrowing and vacuum disc phenomenon. Disc bulge visualized on sagittal images without high-grade spinal canal stenosis. No high-grade foraminal stenosis.  L2-3: Severe disc height loss with partial fusion of the vertebral bodies on the left. Disc bulge and posterior osteophytes without significant spinal canal stenosis. Mild facet arthrosis. Mild foraminal stenosis on the left.  L3-4: Severe disc space narrowing and vacuum disc phenomenon. Disc bulge and posterior osteophytes resulting in lateral recess narrowing. Bilateral facet arthrosis. No significant central spinal canal stenosis. There is severe right foraminal stenosis.  L4-5: Diffuse disc bulge and posterior osteophytes resulting in lateral recess narrowing. There is additional prominence of the dorsal epidural fat indenting the dorsal thecal sac likely resulting in severe spinal canal stenosis. Moderate facet arthrosis slightly greater on the right. There is moderate bilateral foraminal stenosis.  L5-L6: Diffuse disc bulge and posterior osteophytes resulting in lateral recess narrowing. Bilateral facet arthrosis and thickening of the ligamentum flavum. There is at least moderate spinal canal stenosis. Mild right and moderate left foraminal stenosis.  L6-S1: Mild disc height loss. Small disc bulge eccentric to the left with posterior osteophytes possibly resulting in lateral recess narrowing. Bilateral facet arthrosis. No significant central spinal canal stenosis. Mild-to-moderate right foraminal stenosis.  IMPRESSION: Degenerative changes of the lumbar spine as above. 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  Additional  moderate spinal canal stenosis at L5-6.  Multilevel foraminal stenosis, greatest and severe on the right at L3-4. Additional moderate foraminal stenosis bilaterally at L4-5 and on the left at L5-6.  Transitional anatomy as noted above with 6 non-rib-bearing lumbar type vertebra. The lowest well-formed disc space is labeled L6-S1. Recommend radiographic correlation prior to any potential surgical intervention.  Nonobstructing nephrolithiasis in the left kidney measuring up to 0.4 cm. Parenchymal calcification in the posterior right kidney. Additional slightly hyperattenuating focus within the anterior aspect of the left kidney, recommend renal ultrasound for further evaluation.   Electronically Signed By: Donnice Mania M.D. On: 12/24/2023 21:48  Hip Imaging: Hip-L DG 2-3 views: Results for orders placed during the hospital encounter of 04/07/24 DG Hip Unilat W or Wo Pelvis 2-3 Views Left  Narrative CLINICAL DATA:  Two falls yesterday.  EXAM: DG HIP (WITH OR WITHOUT PELVIS) 2-3V LEFT  COMPARISON:  CT abdomen/pelvis dated 04/10/2022.  FINDINGS: No acute fracture or dislocation. Bilateral hip arthroplasties with normal alignment. Sacroiliac joints and pubic symphysis are anatomically aligned.  IMPRESSION: No acute osseous abnormality.   Electronically Signed By: Harrietta Sherry M.D. On: 04/07/2024 11:27  Knee Imaging: Knee-L CT wo contrast: Results for orders placed during the hospital encounter of 04/07/24 CT Knee Left Wo Contrast  Narrative CLINICAL DATA:  Fall, knee trauma  EXAM: CT OF THE LEFT KNEE WITHOUT CONTRAST  TECHNIQUE: Multidetector CT imaging of the left knee was performed according to the standard protocol. Multiplanar CT image reconstructions were also generated.  RADIATION DOSE REDUCTION: This exam was performed according to the departmental dose-optimization program which includes automated exposure control, adjustment of the mA and/or kV  according to patient size and/or use of iterative reconstruction technique.  COMPARISON:  Radiographs 04/07/2024  FINDINGS: Bones/Joint/Cartilage  Prominent osteoarthritis with tricompartmental spurring and articular space narrowing most severely in the lateral compartment.  No fracture is identified.  Moderate knee joint effusion.  Ligaments  Suboptimally assessed by CT.  Muscles and Tendons  Mild regional muscular atrophy.  Soft tissues  Prepatellar subcutaneous edema.  IMPRESSION: 1. No fracture is identified. 2. Prominent osteoarthritis with tricompartmental spurring and articular space narrowing most severely in the lateral compartment. 3. Moderate knee joint effusion. 4. Prepatellar subcutaneous edema.   Electronically Signed By: Ryan Salvage M.D. On: 04/07/2024 13:33  Knee-L DG 4 views: Results for orders placed during the hospital encounter of 04/07/24 DG Knee Complete 4 Views Left  Narrative CLINICAL DATA:  Two falls yesterday.  EXAM: LEFT KNEE - COMPLETE 4+ VIEW  COMPARISON:  None Available.  FINDINGS: No acute fracture or dislocation. Moderate tricompartmental osteoarthritis with joint space narrowing and marginal osteophytosis. Small knee joint effusion. Soft tissues are unremarkable.  IMPRESSION: 1. No acute osseous abnormality. 2. 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   Electronically Signed By: Harrietta Sherry M.D. On: 04/07/2024 11:25  Complexity Note: Imaging results reviewed.                         Meds   Current Outpatient Medications:    acetaminophen  (TYLENOL ) 650 MG CR tablet, Take 650 mg by mouth every 8 (eight) hours as needed for pain., Disp: , Rfl:    albuterol  (PROVENTIL ) (2.5 MG/3ML) 0.083% nebulizer solution, Take 2.5 mg by nebulization every evening., Disp: , Rfl:    budesonide  (PULMICORT ) 0.5 MG/2ML nebulizer solution, Take 0.5 mg by nebulization 2 (two) times  daily., Disp: , Rfl:    Calcium Carb-Cholecalciferol 1000-800 MG-UNIT TABS, Take 2 tablets by mouth daily., Disp: , Rfl:    carvedilol (COREG) 25 MG tablet, Take 25 mg by mouth 2 (two) times daily with a meal., Disp: , Rfl:    DULoxetine  (CYMBALTA ) 60 MG capsule, Take 60 mg by mouth daily., Disp: , Rfl:    Dupilumab (  DUPIXENT) 300 MG/2ML SOPN, Inject 300 mg into the skin every 14 (fourteen) days., Disp: , Rfl:    EPINEPHrine 0.3 mg/0.3 mL IJ SOAJ injection, Inject 0.3 mg into the muscle as needed for anaphylaxis., Disp: , Rfl:    gabapentin  (NEURONTIN ) 600 MG tablet, Take 600 mg by mouth at bedtime., Disp: , Rfl:    Ipratropium-Albuterol  (COMBIVENT) 20-100 MCG/ACT AERS respimat, Inhale 2 puffs into the lungs every 6 (six) hours as needed., Disp: , Rfl:    losartan (COZAAR) 50 MG tablet, Take 50 mg by mouth daily., Disp: , Rfl:    meclizine  (ANTIVERT ) 25 MG tablet, Take 1 tablet (25 mg total) by mouth 2 (two) times daily as needed for dizziness., Disp: 20 tablet, Rfl: 0   montelukast (SINGULAIR) 10 MG tablet, Take 10 mg by mouth at bedtime., Disp: , Rfl:    Multiple Vitamin (MULTIVITAMIN) capsule, Take 1 capsule by mouth daily., Disp: , Rfl:    nortriptyline  (PAMELOR ) 10 MG capsule, Take 10 mg by mouth at bedtime., Disp: , Rfl:    omeprazole (PRILOSEC) 40 MG capsule, Take 40 mg by mouth daily., Disp: , Rfl:    Potassium 99 MG TABS, Take 1 tablet by mouth 2 (two) times daily., Disp: , Rfl:    predniSONE (DELTASONE) 5 MG tablet, Take 5 mg by mouth daily., Disp: , Rfl:    torsemide (DEMADEX) 20 MG tablet, Take 20 mg by mouth 2 (two) times daily., Disp: , Rfl:    warfarin (COUMADIN ) 3 MG tablet, Take 1 tablet by mouth daily. Take as directed (3 mg  on Wed and Fri then 1.5 mg every other day), Disp: , Rfl: 2  ROS  Constitutional: Denies any fever or chills Gastrointestinal: No reported hemesis, hematochezia, vomiting, or acute GI distress Musculoskeletal: Denies any acute onset joint swelling,  redness, loss of ROM, or weakness Neurological: No reported episodes of acute onset apraxia, aphasia, dysarthria, agnosia, amnesia, paralysis, loss of coordination, or loss of consciousness  Allergies  Kelly Olsen is allergic to tramadol, butalbital-apap-caff-cod, codeine, and amlodipine.  PFSH  Drug: Kelly Olsen  reports no history of drug use. Alcohol:  reports no history of alcohol use. Tobacco:  reports that she has never smoked. She has never used smokeless tobacco. Medical:  has a past medical history of Aortic atherosclerosis, Arthritis, Atrial fibrillation (HCC), Bilateral carpal tunnel syndrome, COPD (chronic obstructive pulmonary disease) (HCC), Eosinophilic asthma, GERD (gastroesophageal reflux disease), History of 2019 novel coronavirus disease (COVID-19) (04/2022), History of bilateral cataract extraction, History of kidney stones, Hypertension, Long term current use of anticoagulant, Mild cardiomegaly, Neuropathy of both feet, Presence of permanent cardiac pacemaker (2009), SA node dysfunction (HCC), and Severe tricuspid valve insufficiency. Surgical: Kelly Olsen  has a past surgical history that includes Pacemaker insertion (N/A, 2009); Total hip arthroplasty (Bilateral); Total knee arthroplasty (Right); Cholecystectomy; Cardiac valuve replacement (N/A); Cardiac electrophysiology study and ablation (N/A, 2004); Bilateral carpal tunnel release (Bilateral); Cataract extraction, bilateral; Cardiac electrophysiology study and ablation (N/A, 2006); PACEMAKER BATTERY CHANGE (N/A, 2018); Carpal tunnel release (Right, 02/10/2023); and Carpal tunnel release (Left, 03/31/2023). Family: family history is not on file.  Constitutional Exam  General appearance: Well nourished, well developed, and well hydrated. In no apparent acute distress Vitals:   06/29/24 1318  BP: 125/66  Pulse: 70  Resp: 16  Temp: (!) 97.5 F (36.4 C)  TempSrc: Temporal  SpO2: 98%  Weight: 205 lb (93 kg)  Height: 5' 7  (1.702 m)   BMI Assessment: Estimated body mass  index is 32.11 kg/m as calculated from the following:   Height as of this encounter: 5' 7 (1.702 m).   Weight as of this encounter: 205 lb (93 kg).  BMI interpretation table: BMI level Category Range association with higher incidence of chronic pain  <18 kg/m2 Underweight   18.5-24.9 kg/m2 Ideal body weight   25-29.9 kg/m2 Overweight Increased incidence by 20%  30-34.9 kg/m2 Obese (Class I) Increased incidence by 68%  35-39.9 kg/m2 Severe obesity (Class II) Increased incidence by 136%  >40 kg/m2 Extreme obesity (Class III) Increased incidence by 254%   Patient's current BMI Ideal Body weight  Body mass index is 32.11 kg/m. Ideal body weight: 61.6 kg (135 lb 12.9 oz) Adjusted ideal body weight: 74.2 kg (163 lb 7.7 oz)   BMI Readings from Last 4 Encounters:  06/29/24 32.11 kg/m  06/19/24 32.11 kg/m  06/13/24 31.48 kg/m  05/25/24 31.48 kg/m   Wt Readings from Last 4 Encounters:  06/29/24 205 lb (93 kg)  06/19/24 205 lb (93 kg)  06/13/24 201 lb (91.2 kg)  05/25/24 201 lb (91.2 kg)    Psych/Mental status: Alert, oriented x 3 (person, place, & time)       Eyes: PERLA Respiratory: No evidence of acute respiratory distress  Assessment & Plan  Primary Diagnosis & Pertinent Problem List: The primary encounter diagnosis was 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 Visit Diagnosis: 1. 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 Chronic upper back pain (2ry area of Pain) (Bilateral)   3. Chronic lower extremity pain (3ry area of Pain) (Right) (intermittent)   4. Lumbar Dynamic Retrolisthesis w/ instability   5. Degeneration of intervertebral disc of lumbar region with discogenic back pain and lower extremity pain   6. 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 DDD (degenerative disc disease), thoracic   8. Abnormal x-ray of lumbar spine (05/11/2024)   9. Abnormal CT scan, lumbar spine (05/30/2024)   10. Abnormal CT of thoracic spine (05/30/2024)   11. Chronic anticoagulation (Coumadin )   12. Cardiac pacemaker in situ   13. Lumbosacral radiculopathy at L2 (Right)    Problems updated and reviewed during this visit: Problem  Lumbosacral radiculopathy at L2 (Right)  Abnormal CT scan, lumbar spine (05/30/2024)   (05/30/2024) LUMBAR SPINE CT SCAN FINDINGS: BONES AND ALIGNMENT: There are 6 non-rib-bearing lumbar type vertebrae again demonstrated. The last well-formed disc space will again be considered L6-S1. There is mild sigmoid scoliosis of the thoracolumbar spine, with the lower lumbar curvature convex to the left and the upper curvature convex to the right. Normal vertebral body heights. No acute fracture or suspicious bone lesion. DEGENERATIVE CHANGES: T12-L1: Mild right posterolateral disc bulging causing mild central spinal canal stenosis and right lateral recess stenosis. L1-2: There is broad-based disc bulging with mild-to-moderate central spinal canal stenosis and mild bilateral lateral recess stenosis. L2-3: There is moderate disc  space loss and minimal disc bulging. L3-4: There is slight degenerative retrolisthesis. There is broad-based disc bulging and  endplate ridging with mild central spinal canal stenosis and mild-to-moderate bilateral lateral recess stenosis. L4-5: There is broad-based disc bulging and bilateral facet hypertrophy, with moderate central spinal canal stenosis and moderate bilateral lateral recess stenosis. There appears to be impingement of the L5 nerves in the lateral recesses bilaterally. L5-6: There is broad-based disc bulge and bilateral facet arthrosis, with mild-to-moderate central spinal canal stenosis and bilateral lateral recess stenosis. L6-S1: The spinal canal and neural foramina are widely patent.  SOFT TISSUES: No acute abnormality.  IMPRESSION: 1. Mild sigmoid scoliosis of the thoracolumbar spine, as described. 2. 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 3. Multilevel degenerative changes, most pronounced at L4-5 with moderate central canal stenosis, moderate bilateral lateral recess stenosis, and impingement of the L5 nerve roots in the lateral recesses bilaterally.   Abnormal CT of thoracic spine (05/30/2024)   (05/30/2024) THORACIC SPINE CT SCAN FINDINGS: BONES AND ALIGNMENT: Normal vertebral body heights. No acute fracture or suspicious bone lesion. Mild levoscoliosis of the lower thoracic spine. Slight degenerative anterolisthesis at C7-T1. DEGENERATIVE CHANGES: Mild degenerative disc disease present throughout the thoracic spine. At T10-11, there is disc space narrowing, posterior disc bulging, and endplate ridging causing mild-to-moderate central spinal canal stenosis, more pronounced on the right. There is no apparent spinal cord or nerve root impingement. At T11-12, there is minimal disc bulging without significant spinal canal or neural foraminal stenosis. Moderate chronic degenerative disc disease at C5-6 with posterior endplate ridging causing mild-to-moderate central spinal canal stenosis. There are a couple of bubbles  of air present within the thecal sac at T8 and T10, which appear to be iatrogenic. SOFT TISSUES: A cardiac pacemaker is incidentally noted. LUNGS: Mild atelectasis present independently within the lungs.  IMPRESSION: 1. Mild-to-moderate central spinal canal stenosis at T10-11, greater on the right, without apparent spinal cord or nerve root impingement. 2. Minimal disc bulging at T11-12 without significant spinal canal or neural foraminal stenosis. 3. Mild levoscoliosis of the lower thoracic spine. 4. Moderate chronic degenerative disc disease at C5-6 with posterior endplate ridging causing mild-to-moderate central spinal canal stenosis. 5. Slight degenerative anterolisthesis at C7-T1.   Lumbar Dynamic Retrolisthesis w/ instability (L2/L3/L4)   Dynamic retrolisthesis with instability:  L2 retrolisthesis 4 mm on neutral/flexion increasing to 7 mm on extension L3 retrolisthesis 4 mm on extension L4 retrolisthesis 2 mm on extension     Plan of Care  Pharmacotherapy (Medications Ordered): No orders of the defined types were placed in this encounter.  Procedure Orders         Lumbar Epidural Injection     Orders Placed This Encounter  Procedures   Lumbar Epidural Injection    Standing Status:   Future    Expiration Date:   09/27/2024    Scheduling Instructions:     Procedure: Interlaminar Lumbar Epidural Steroid injection (LESI)  L2-3     Laterality: Right-sided     Sedation: Patient's choice.     Timeframe: see Blood-thinner protocol.    Where will this procedure be performed?:   [facility]   Blood Thinner Instructions to Nursing    If unable to stop, ask if Lovenox-bridge therapy may be possible, and if so, request their assistance in implementing it.    Scheduling Instructions:     Contact the physician prescribing the blood thinner and request clearance to stop it  for time period stipulated below.     If approved by prescribing physician, stop Coumadin  (Warfarin) X 5  days prior to procedure or surgery.   Blood Thinner Instructions to Nursing    Always make sure patient has clearance from prescribing physician to stop blood thinners for interventional therapies. If the patient requires a Lovenox-bridge therapy, make sure arrangements are made to institute it with the assistance of the PCP.    Scheduling Instructions:     Have Kelly Olsen stop the Coumadin  (Warfarin) X 5 days prior to procedure or surgery.   Lab Orders  No laboratory test(s) ordered today   Imaging Orders  No imaging studies ordered today   Referral Orders  No referral(s) requested today    Pharmacological management:  Opioid Analgesics: I will not be prescribing any opioids at this time Membrane stabilizer: I will not be prescribing any at this time Muscle relaxant: I will not be prescribing any at this time NSAID: I will not be prescribing any at this time Other analgesic(s): I will not be prescribing any at this time      Interventional Therapies  Risk Factors  Considerations  Medical Comorbidities:  Coumadin  Anticoagulation: (Stop: 5 days  Restart: 2 hours)  A-Fib  CHF  Hx. Tricuspid Repair (2005)  Pacemaker in-situ     Planned  Pending:   Diagnostic right L2-3 LESI #1    Under consideration:   Diagnostic right L2-3 LESI #1  Diagnostic bilateral lumbar spinal cord stimulator trial    Completed: (Analgesic benefit)1  None at this time   Therapeutic  Palliative (PRN) options:   None established   Completed by other providers:   Medical psychology evaluation for trial completed.  No contraindications.  1(Analgesic benefit): Expressed in percentage (%). (Local anesthetic[LA] +/- sedation  L.A.Local Anesthetic  Steroid benefit  Ongoing benefit)      Provider-requested follow-up: Return for (ECT):(R) L2-3 LESI #1, (Blood Thinner Protocol). Recent Visits Date Type Provider Dept  06/13/24 Office Visit Tanya Glisson, MD [facility]  Showing  recent visits within past 90 days and meeting all other requirements Today's Visits Date Type Provider Dept  06/29/24 Office Visit Tanya Glisson, MD [facility]  Showing today's visits and meeting all other requirements Future Appointments No visits were found meeting these conditions. Showing future appointments within next 90 days and meeting all other requirements   Primary Care Physician: Cyrus Selinda Moose, PA-C  Duration of encounter: 53 minutes.  Total time on encounter, as per AMA guidelines included both the face-to-face and non-face-to-face time personally spent by the physician and/or other qualified health care professional(s) on the day of the encounter (includes time in activities that require the physician or other qualified health care professional and does not include time in activities normally performed by clinical staff). Physician's time may include the following activities when performed: Preparing to see the patient (e.g., pre-charting review of records, searching for previously ordered imaging, lab work, and nerve conduction tests) Review of prior analgesic pharmacotherapies. Reviewing PMP Interpreting ordered tests (e.g., lab work, imaging, nerve conduction tests) Performing post-procedure evaluations, including interpretation of diagnostic procedures Obtaining and/or reviewing separately obtained history Performing a medically appropriate examination and/or evaluation Counseling and educating the patient/family/caregiver Ordering medications, tests, or procedures Referring and communicating with other health care professionals (when not separately reported) Documenting clinical information in the electronic or other health record Independently interpreting results (not separately reported) and communicating results to the patient/ family/caregiver Care coordination (not separately  reported)  Note by: Eric DELENA Como, MD (TTS technology  used. I apologize for any typographical errors that were not detected and corrected.) Date: 06/29/2024; Time: 3:40 PM

## 2024-07-05 ENCOUNTER — Telehealth: Payer: Self-pay | Admitting: Pain Medicine

## 2024-07-05 NOTE — Telephone Encounter (Signed)
 PT stated that she did not want to move forward with the procedure that was dicussed.
